# Patient Record
Sex: Female | Born: 1975 | Race: White | Hispanic: No | Smoking: Former smoker
Health system: Southern US, Community
[De-identification: ages and names within clinical notes are randomized; demographics above are authoritative.]

## PROBLEM LIST (undated history)

## (undated) ENCOUNTER — Emergency Department: Discharge status: Absent without leave | Payer: Self-pay

## (undated) DIAGNOSIS — E669 Obesity, unspecified: Secondary | ICD-10-CM

## (undated) DIAGNOSIS — E785 Hyperlipidemia, unspecified: Secondary | ICD-10-CM

## (undated) DIAGNOSIS — A6000 Herpesviral infection of urogenital system, unspecified: Secondary | ICD-10-CM

## (undated) HISTORY — DX: Obesity, unspecified: E66.9

## (undated) HISTORY — PX: TONSILLECTOMY: SUR1361

---

## 1999-01-01 ENCOUNTER — Emergency Department (HOSPITAL_COMMUNITY): Admission: EM | Admit: 1999-01-01 | Discharge: 1999-01-01 | Payer: Self-pay | Admitting: Emergency Medicine

## 1999-01-01 ENCOUNTER — Encounter: Payer: Self-pay | Admitting: Emergency Medicine

## 2008-12-28 LAB — CONVERTED CEMR LAB: Pap Smear: NORMAL

## 2009-11-24 ENCOUNTER — Ambulatory Visit: Payer: Self-pay | Admitting: Family Medicine

## 2009-11-24 DIAGNOSIS — R61 Generalized hyperhidrosis: Secondary | ICD-10-CM

## 2009-11-24 DIAGNOSIS — A6000 Herpesviral infection of urogenital system, unspecified: Secondary | ICD-10-CM | POA: Insufficient documentation

## 2010-01-05 ENCOUNTER — Ambulatory Visit: Payer: Self-pay | Admitting: Family Medicine

## 2010-01-05 ENCOUNTER — Encounter: Payer: Self-pay | Admitting: Family Medicine

## 2010-01-08 LAB — CONVERTED CEMR LAB
ALT: 21 units/L (ref 0–35)
AST: 18 units/L (ref 0–37)
Albumin: 4.7 g/dL (ref 3.5–5.2)
Alkaline Phosphatase: 60 units/L (ref 39–117)
BUN: 11 mg/dL (ref 6–23)
CO2: 25 meq/L (ref 19–32)
Calcium: 9.4 mg/dL (ref 8.4–10.5)
Chloride: 102 meq/L (ref 96–112)
Cholesterol: 183 mg/dL (ref 0–200)
Creatinine, Ser: 0.78 mg/dL (ref 0.40–1.20)
Glucose, Bld: 98 mg/dL (ref 70–99)
HDL: 55 mg/dL (ref >39)
LDL Cholesterol: 111 mg/dL — ABNORMAL HIGH (ref 0–99)
Potassium: 4.6 meq/L (ref 3.5–5.3)
Sodium: 138 meq/L (ref 135–145)
Total Bilirubin: 0.9 mg/dL (ref 0.3–1.2)
Total CHOL/HDL Ratio: 3.3
Total Protein: 7.3 g/dL (ref 6.0–8.3)
Triglycerides: 87 mg/dL (ref <150)
VLDL: 17 mg/dL (ref 0–40)

## 2010-02-27 NOTE — Assessment & Plan Note (Signed)
Summary: CPE   Vital Signs:  Patient profile:   35 year old female Height:      64.5 inches Weight:      184 pounds Pulse rate:   95 / minute BP sitting:   133 / 80  (right arm) Cuff size:   regular  Vitals Entered By: Avon Gully CMA, Duncan Dull) (January 05, 2010 9:06 AM) CC: CPE  no pap   CC:  CPE  no pap.  History of Present Illness: Here for CPE. Her pap is scheduled for January. HAd an abnormal mammo this year but had f/u US and this was Ok. They are repeating her mammo in 6 months.   Current Medications (verified): 1)  Acyclovir 200 Mg Caps (Acyclovir) .... Take One Tablet By Mouth Once A Day 2)  Acidophilus  Caps (Lactobacillus) .... One Capsule By Mouth Once A Day 3)  Hypercare 20 % Soln (Aluminum Chloride) .... Apply At Bedtime 2-3 X A Week. 4)  Diflucan 150 Mg Tabs (Fluconazole) .... Take 1 Tablet By Mouth Once A Day X 1 Day and Then Repeat in 3 Days.  Allergies (verified): No Known Drug Allergies  Comments:  Nurse/Medical Assistant: The patient's medications and allergies were reviewed with the patient and were updated in the Medication and Allergy Lists. Avon Gully CMA, Duncan Dull) (January 05, 2010 9:07 AM)  Past History:  Past Surgical History: tonsillecotmy 1990 c/sec 1996 Toe fracture repair 2005, left foot, 5th digit  Social History: Charity fundraiser. Former Smoker Alcohol use-yes Drug use-no Regular exercise-yes  24 oz of caffein a day.   Review of Systems  The patient denies anorexia, fever, weight loss, weight gain, vision loss, decreased hearing, hoarseness, chest pain, syncope, dyspnea on exertion, peripheral edema, prolonged cough, headaches, hemoptysis, abdominal pain, melena, hematochezia, severe indigestion/heartburn, hematuria, incontinence, genital sores, muscle weakness, suspicious skin lesions, transient blindness, difficulty walking, depression, unusual weight change, abnormal bleeding, enlarged lymph nodes, and breast masses.     Physical Exam  General:  Well-developed,well-nourished,in no acute distress; alert,appropriate and cooperative throughout examination Head:  Normocephalic and atraumatic without obvious abnormalities. No apparent alopecia or balding. Eyes:  No corneal or conjunctival inflammation noted. EOMI. Perrla.  Ears:  External ear exam shows no significant lesions or deformities.  Otoscopic examination reveals clear canals, tympanic membranes are intact bilaterally without bulging, retraction, inflammation or discharge. Hearing is grossly normal bilaterally. Nose:  External nasal examination shows no deformity or inflammation.  Mouth:  Oral mucosa and oropharynx without lesions or exudates.  Teeth in good repair. Neck:  No deformities, masses, or tenderness noted. Chest Wall:  No deformities, masses, or tenderness noted. Breasts:  No mass, nodules, thickening, tenderness, bulging, retraction, inflamation, nipple discharge or skin changes noted.   Lungs:  Normal respiratory effort, chest expands symmetrically. Lungs are clear to auscultation, no crackles or wheezes. Heart:  Normal rate and regular rhythm. S1 and S2 normal without gallop, murmur, click, rub or other extra sounds. Abdomen:  Bowel sounds positive,abdomen soft and non-tender without masses, organomegaly or hernias noted. Msk:  No deformity or scoliosis noted of thoracic or lumbar spine.   Pulses:  R and L carotid,radial,dorsalis pedis and posterior tibial pulses are full and equal bilaterally Extremities:  No clubbing, cyanosis, edema, or deformity noted with normal full range of motion of all joints.   Neurologic:  No cranial nerve deficits noted. Station and gait are normal. Plantar reflexes are down-going bilaterally.  Sensory, motor and coordinative functions appear intact. Skin:  no rashes.  Cervical Nodes:  No lymphadenopathy noted Axillary Nodes:  No palpable lymphadenopathy Psych:  Cognition and judgment appear intact. Alert and  cooperative with normal attention span and concentration. No apparent delusions, illusions, hallucinations   Impression & Recommendations:  Problem # 1:  HEALTH MAINTENANCE EXAM (ICD-V70.0) Exam is normal today.  she is getting regular exercis now which is wonderful.  Encourage regular exicise and daily calcium Due for screening labs.  Orders: T-Comprehensive Metabolic Panel [phone]) T-Lipid Profile [phone])  Complete Medication List: 1)  Acyclovir 200 Mg Caps (Acyclovir) .... Take one tablet by mouth once a day 2)  Acidophilus Caps (Lactobacillus) .... One capsule by mouth once a day 3)  Hypercare 20 % Soln (Aluminum chloride) .... Apply at bedtime 2-3 x a week.  Other Orders: Tdap => 31yrs IM ([id number]) Admin 1st Vaccine (69629)   Patient Instructions: 1)  It is important that you exercise reguarly at least 20 minutes 5 times a week. If you develop chest pain, have severe difficulty breathing, or feel very tired, stop exercising immediately and seek medical attention.  2)  Take calcium +vitamin D daily.  3)  We will call you with your lab results next week.    Orders Added: 1)  T-Comprehensive Metabolic Panel [[id number]] 2)  T-Lipid Profile [[id number]] 3)  Tdap => 71yrs IM [90715] 4)  Admin 1st Vaccine [90471] 5)  Est. Patient age 39-39 [37]   Immunizations Administered:  Tetanus Vaccine:    Vaccine Type: Tdap    Site: right deltoid    Mfr: GlaxoSmithKline    Dose: 0.5 ml    Route: IM    Given by: Sue Lush McCrimmon CMA, (AAMA)    Exp. Date: 11/17/2011    Lot #: [id number]    VIS given: 12/16/07 version given January 05, 2010.   Immunizations Administered:  Tetanus Vaccine:    Vaccine Type: Tdap    Site: right deltoid    Mfr: GlaxoSmithKline    Dose: 0.5 ml    Route: IM    Given by: Sue Lush McCrimmon CMA, (AAMA)    Exp. Date: 11/17/2011    Lot #: [id number]    VIS given: 12/16/07 version given January 05, 2010.  Last PAP:  normal  (01/07/2007 12:55:55 PM) PAP Result Date:  12/28/2008 PAP Result:  normal PAP Next Due:  1 yr

## 2010-02-27 NOTE — Assessment & Plan Note (Signed)
Summary: NOV: GEt estab   Vital Signs:  Patient profile:   35 year old [name] Height:      64.5 inches Weight:      185 pounds BMI:     31.38 Pulse rate:   Breanna / minute BP sitting:   119 / 76  (right arm) Cuff size:   large  Vitals Entered By: Avon Gully CMA, Duncan Dull) (November 24, 2009 8:24 AM) CC: NP esr care   CC:  NP esr care.  History of Present Illness: Going for mammogram today. Breanna [name] wiht hx of BrCA at age 19.   Habits & Providers  Alcohol-Tobacco-Diet     Alcohol drinks/day: <1     Tobacco Status: quit  Exercise-Depression-Behavior     Does Patient Exercise: no     STD Risk: current     Drug Use: no     Seat Belt Use: always  Current Medications (verified): 1)  Acyclovir 200 Mg Caps (Acyclovir) .... Take One Tablet By Mouth Once A Day 2)  Acidophilus  Caps (Lactobacillus) .... One Capsule By Mouth Once A Day 3)  Hypercare 20 % Soln (Aluminum Chloride)  Allergies (verified): No Known Drug Allergies  Comments:  Nurse/Medical Assistant: The patient's medications and allergies were reviewed with the patient and were updated in the Medication and Allergy Lists. Avon Gully CMA, Duncan Dull) (November 24, 2009 8:26 AM)  Past History:  Past Medical History: None  Past Surgical History: tonsillecotmy 1990 c/sec 1996 Toe fracture repair 2005  Family History: Breanna [name] with BrCa at age 9 ( post menopausal) , DM Parents with hi chol, HTN  Social History: Former Smoker Alcohol use-yes Drug use-no Regular exercise-no 24 oz of caffein a day. Smoking Status:  quit STD Risk:  current Drug Use:  no Seat Belt Use:  always Does Patient Exercise:  no  Review of Systems       No fever/sweats/weakness, unexplained weight loss/gain.  No vison changes.  No difficulty hearing/ringing in ears, + hay fever/allergies.  No chest pain/discomfort, palpitations.  No Br lump/nipple discharge.  No cough/wheeze.  No blood in BM, nausea/vomiting/diarrhea.  No nighttime  urination, leaking urine, unusual vaginal bleeding, discharge (penis or vagina).  No muscle/joint pain. No rash, change in mole.  No HA, memory loss.  No anxiety, sleep d/o, depression.  No easy bruising/bleeding, unexplained lump   Physical Exam  General:  Well-developed,well-nourished,in no acute distress; alert,appropriate and cooperative throughout examination Head:  Normocephalic and atraumatic without obvious abnormalities. No apparent alopecia or balding. Lungs:  Normal respiratory effort, chest expands symmetrically. Lungs are clear to auscultation, no crackles or wheezes. Heart:  Normal rate and regular rhythm. S1 and S2 normal without gallop, murmur, click, rub or other extra sounds.   Impression & Recommendations:  Problem # 1:  HYPERHIDROSIS (ICD-780.8) Refilled her hypercare. She is on one a month maintenance for that.  Did recommend schedule a CPE.   She has had her flu vac already.   Complete Medication List: 1)  Acyclovir 200 Mg Caps (Acyclovir) .... Take one tablet by mouth once a day 2)  Acidophilus Caps (Lactobacillus) .... One capsule by mouth once a day 3)  Hypercare 20 % Soln (Aluminum chloride) .... Apply at bedtime 2-3 x a week. 4)  Diflucan 150 Mg Tabs (Fluconazole) .... Take 1 tablet by mouth once a day x 1 day and then repeat in 3 days.  Patient Instructions: 1)  Let us know when you had your tetanus vaccine.   2)  Consider scheduling a physical.  Prescriptions: HYPERCARE 20 % SOLN (ALUMINUM CHLORIDE) Apply at bedtime 2-3 x a week.  #1 bottle. x 1   Entered and Authorized by:   Nani Gasser MD   Signed by:   Nani Gasser MD on 11/24/2009   Method used:   Electronically to        [facility] [phone]* (retail)       [address] [place], [place]  [address]       Ph: [phone]       Fax: [phone]   RxID:   [id number]    Orders Added: 1)  New Patient Level II [81829]   Immunization History:  Influenza  Immunization History:    Influenza:  historical (10/30/2009)   Immunization History:  Influenza Immunization History:    Influenza:  Historical (10/30/2009)   Immunization History:  Influenza Immunization History:    Influenza:  historical (10/30/2009)  Appended Document: NOV: GEt estab   Preventive Care Screening  Pap Smear:    Date:  01/07/2007    Next Due:  12/2007    Results:  normal

## 2010-03-01 NOTE — Letter (Signed)
Summary: Records Dated 10-09-06 thru 11-03-09/[facility]  Records Dated 10-09-06 thru 11-03-09/[facility]   Imported By: Lanelle Bal 01/15/2010 10:40:24  _____________________________________________________________________  External Attachment:    Type:   Image     Comment:   External Document

## 2010-06-04 ENCOUNTER — Encounter: Payer: Self-pay | Admitting: Emergency Medicine

## 2010-06-04 ENCOUNTER — Inpatient Hospital Stay (INDEPENDENT_AMBULATORY_CARE_PROVIDER_SITE_OTHER)
Admission: RE | Admit: 2010-06-04 | Discharge: 2010-06-04 | Disposition: A | Discharge status: Home / Self Care | Payer: BC Managed Care – PPO | Source: Ambulatory Visit | Attending: Emergency Medicine | Admitting: Emergency Medicine

## 2010-06-04 DIAGNOSIS — Z23 Encounter for immunization: Secondary | ICD-10-CM

## 2010-06-04 DIAGNOSIS — S61409A Unspecified open wound of unspecified hand, initial encounter: Secondary | ICD-10-CM | POA: Insufficient documentation

## 2010-06-07 ENCOUNTER — Telehealth (INDEPENDENT_AMBULATORY_CARE_PROVIDER_SITE_OTHER): Payer: Self-pay | Admitting: Emergency Medicine

## 2010-12-26 ENCOUNTER — Emergency Department
Admission: EM | Admit: 2010-12-26 | Discharge: 2010-12-26 | Disposition: A | Discharge status: Home / Self Care | Payer: BC Managed Care – PPO | Source: Home / Self Care

## 2010-12-26 DIAGNOSIS — Z23 Encounter for immunization: Secondary | ICD-10-CM

## 2010-12-26 MED ORDER — INFLUENZA VAC TYP A&B SURF ANT IM INJ: 0.5000 mL | INJECTION | INTRAMUSCULAR | Status: DC

## 2010-12-26 MED ORDER — INFLUENZA VAC TYP A&B SURF ANT IM INJ
0.5000 mL | INJECTION | INTRAMUSCULAR | Status: AC
  Administered 2010-12-26: 0.5 mL via INTRAMUSCULAR

## 2010-12-31 NOTE — Telephone Encounter (Signed)
  Phone Note Outgoing Call   Call placed by: Emilio Math,  Jun 07, 2010 11:50 AM Call placed to: Patient Summary of Call: Hand is sore but shows no signs of infection.

## 2010-12-31 NOTE — Progress Notes (Signed)
Summary: RT HAND INJURY (DOG BITE)/WSE   Vital Signs:  Patient Profile:   35 Years Old Female CC:      dog bite to right hand x today Height:     64.5 inches Weight:      192 pounds O2 Sat:      98 % O2 treatment:    Room Air Temp:     99.5 degrees F oral Pulse rate:   121 / minute Resp:     16 per minute BP sitting:   123 / 86  (left arm) Cuff size:   large  Vitals Entered By: Lajean Saver RN (Jun 04, 2010 6:36 PM)                  Updated Prior Medication List: CLARITIN-D 24 HOUR 10-240 MG XR24H-TAB (LORATADINE-PSEUDOEPHEDRINE)  MULTIVITAMINS  TABS (MULTIPLE VITAMIN) once daily  Current Allergies: ! NAPROSYNHistory of Present Illness History from: patient Chief Complaint: dog bite to right hand x today History of Present Illness: She was protecting her dog today and her neighbor's dog bit her hand.  They say the neighbot's dog is UTD on shots.   It drew blood in her R palm and her R hand is also red.  She doesn't recall her last Td.  Pain and mild swelling.  Not using any meds or modalities yet.  No pus or drainage.  She is R handed.  REVIEW OF SYSTEMS Constitutional Symptoms      Denies fever, chills, night sweats, weight loss, weight gain, and fatigue.  Eyes       Denies change in vision, eye pain, eye discharge, glasses, contact lenses, and eye surgery. Ear/Nose/Throat/Mouth       Denies hearing loss/aids, change in hearing, ear pain, ear discharge, dizziness, frequent runny nose, frequent nose bleeds, sinus problems, sore throat, hoarseness, and tooth pain or bleeding.  Respiratory       Denies dry cough, productive cough, wheezing, shortness of breath, asthma, bronchitis, and emphysema/COPD.  Cardiovascular       Denies murmurs, chest pain, and tires easily with exhertion.    Gastrointestinal       Denies stomach pain, nausea/vomiting, diarrhea, constipation, blood in bowel movements, and indigestion. Genitourniary       Denies painful urination, kidney  stones, and loss of urinary control. Neurological       Denies paralysis, seizures, and fainting/blackouts. Musculoskeletal       Denies muscle pain, joint pain, joint stiffness, decreased range of motion, redness, swelling, muscle weakness, and gout.  Skin       Denies bruising, unusual mles/lumps or sores, and hair/skin or nail changes.  Psych       Denies mood changes, temper/anger issues, anxiety/stress, speech problems, depression, and sleep problems. Other Comments: Broke up a fight between the neighbors dog and her dog. Small punture wound to right hand. Bleeding stopped, swelling notedc.Cleaned with Hibicleanse. TDaP needed. Bite report already made by the owners of the dogs to [employer]   Past History:  Past Medical History: Reviewed history from 11/24/2009 and no changes required. None  Past Surgical History: Reviewed history from 01/05/2010 and no changes required. tonsillecotmy 1990 c/sec 1996 Toe fracture repair 2005, left foot, 5th digit  Family History: Reviewed history from 11/24/2009 and no changes required. 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  Physical Exam General appearance: well developed, well nourished, no acute distress Head: normocephalic, atraumatic MSE: oriented to time, place, and person R hand with scattered erythema with a puncture mark on her R thenar emanance.  No bleeding.  Mild TTP.  Minimal localized swelling.  No ecchymoses.  FROM of thumb and all fingers.  Sensation and pulses and cap refill all intact. Assessment New Problems: WOUND, OPEN, HAND, WITHOUT COMPLICATIONS (ICD-882.0)   Plan New Medications/Changes: DOXYCYCLINE HYCLATE 100 MG CAPS (DOXYCYCLINE HYCLATE) 1 by mouth two times a day for 10 days  #20  x 0, 06/04/2010, Hoyt Koch MD  New Orders: Tdap => 47yrs IM [90715] Admin 1st Vaccine [90471] New Patient Level III [99203] Planning Comments:   Treat with Doxy to cover canine oral bacteria.  Td given in clinic.  Hand washed by nurse and cleansed.  Hand covered with bandage.  Wound precautions given.  We'll call patient back in 3 days to check.  They have already contacted the county re: the bite.   The patient and/or caregiver has been counseled thoroughly with regard to medications prescribed including dosage, schedule, interactions, rationale for use, and possible side effects and they verbalize understanding.  Diagnoses and expected course of recovery discussed and will return if not improved as expected or if the condition worsens. Patient and/or caregiver verbalized understanding.  Prescriptions: DOXYCYCLINE HYCLATE 100 MG CAPS (DOXYCYCLINE HYCLATE) 1 by mouth two times a day for 10 days  #20 x 0   Entered and Authorized by:   Hoyt Koch MD   Signed by:   Hoyt Koch MD on 06/04/2010   Method used:   Print then Give to Patient   RxID:   [id number]   Orders Added: 1)  Tdap => 83yrs IM [14782] 2)  Admin 1st Vaccine [90471] 3)  New Patient Level III [95621]   Immunizations Administered:  Tetanus Vaccine:    Vaccine Type: Tdap    Site: left deltoid    Mfr: GlaxoSmithKline    Dose: 0.5 ml    Route: IM    Given by: Lajean Saver RN    Exp. Date: 11/17/2011    Lot #: [id number]    VIS given: 12/16/07 version given Jun 04, 2010.   Immunizations Administered:  Tetanus Vaccine:    Vaccine Type: Tdap    Site: left deltoid    Mfr: GlaxoSmithKline    Dose: 0.5 ml    Route: IM    Given by: Lajean Saver RN    Exp. Date: 11/17/2011    Lot #: [id number]    VIS given: 12/16/07 version given Jun 04, 2010.

## 2011-11-19 ENCOUNTER — Encounter: Payer: Self-pay | Admitting: *Deleted

## 2011-11-19 ENCOUNTER — Emergency Department (INDEPENDENT_AMBULATORY_CARE_PROVIDER_SITE_OTHER)
Admission: EM | Admit: 2011-11-19 | Discharge: 2011-11-19 | Disposition: A | Discharge status: Home / Self Care | Payer: BC Managed Care – PPO | Source: Home / Self Care

## 2011-11-19 DIAGNOSIS — Z23 Encounter for immunization: Secondary | ICD-10-CM

## 2011-11-19 MED ORDER — INFLUENZA VAC TYP A&B SURF ANT IM INJ
0.5000 mL | INJECTION | Freq: Once | INTRAMUSCULAR | Status: AC
  Administered 2011-11-19: 0.5 mL via INTRAMUSCULAR

## 2011-11-19 NOTE — ED Notes (Signed)
The pt is here today for a flu vaccine.  

## 2012-05-13 ENCOUNTER — Telehealth: Payer: Self-pay | Admitting: Family Medicine

## 2012-05-13 NOTE — Telephone Encounter (Signed)
Ok to change

## 2012-05-13 NOTE — Telephone Encounter (Signed)
Patient is a pt of Dr. Linford Arnold but hasnt seen her since 12/2009 not quite 3 yrs but she request to change providers to Dr. Karie Schwalbe. Thanks

## 2012-05-13 NOTE — Telephone Encounter (Signed)
Hmm, doesn't look like I have ever seen her but it is fine with me.

## 2012-05-26 ENCOUNTER — Encounter: Payer: Self-pay | Admitting: Sports Medicine

## 2012-05-26 ENCOUNTER — Ambulatory Visit (INDEPENDENT_AMBULATORY_CARE_PROVIDER_SITE_OTHER): Payer: BC Managed Care – PPO | Admitting: Sports Medicine

## 2012-05-26 VITALS — BP 130/86 | HR 95 | Wt 220.0 lb

## 2012-05-26 DIAGNOSIS — N6002 Solitary cyst of left breast: Secondary | ICD-10-CM | POA: Insufficient documentation

## 2012-05-26 DIAGNOSIS — Z0000 Encounter for general adult medical examination without abnormal findings: Secondary | ICD-10-CM

## 2012-05-26 DIAGNOSIS — R21 Rash and other nonspecific skin eruption: Secondary | ICD-10-CM

## 2012-05-26 DIAGNOSIS — Z299 Encounter for prophylactic measures, unspecified: Secondary | ICD-10-CM

## 2012-05-26 DIAGNOSIS — E669 Obesity, unspecified: Secondary | ICD-10-CM | POA: Insufficient documentation

## 2012-05-26 MED ORDER — PHENTERMINE HCL 37.5 MG PO CAPS: 37.5000 mg | ORAL_CAPSULE | ORAL | Status: DC

## 2012-05-26 NOTE — Assessment & Plan Note (Signed)
Triamcinolone cream. I do suspect this is a contact dermatitis for an irritant dermatitis. This is a common location for rosacea, and she will keep a log of what worsens it, and take a picture next time it occurs. Because it is also photosensitive, I'm going to add an ANA to her blood work.

## 2012-05-26 NOTE — Assessment & Plan Note (Signed)
Gets every 6 month mammograms. This is done through [facility] imaging.

## 2012-05-26 NOTE — Assessment & Plan Note (Signed)
Gets yearly Pap smears, last was January. This is done by her gynecologist at [facility]. Checking some routine blood work.

## 2012-05-26 NOTE — Progress Notes (Signed)
  Subjective:    CC: Complete physical  HPI:  Preventive measure: Needs some blood work, last Pap smear was January of this year, goes to a gynecologist in town. Had a mammogram recently, needs them every year, she does have cystic changes in the left breast.  Left breast cyst: Has frequent mammograms. Stable.  Facial rash: Presently she wakes up, she describes them as small red bumps, they're not itchy, no perfumes, does wear safety goggles at work. It is not worse with spicy foods, temperature changes, or stress. 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  Obesity: Has tried exercise, ineffective. Would like to discuss ways to cut weight.  Past medical history, Surgical history, Family history not pertinant except as noted below, Social history, Allergies, and medications have been entered into the medical record, reviewed, and no changes needed.   Review of Systems: No headache, visual changes, nausea, vomiting, diarrhea, constipation, dizziness, abdominal pain, skin rash, fevers, chills, night sweats, swollen lymph nodes, weight loss, chest pain, body aches, joint swelling, muscle aches, shortness of breath, mood changes, visual or auditory hallucinations.  Objective:    General: Well Developed, well nourished, and in no acute distress.  Neuro: Alert and oriented x3, extra-ocular muscles intact, sensation grossly intact.  HEENT: Normocephalic, atraumatic, pupils equal round reactive to light, neck supple, no masses, no lymphadenopathy, thyroid nonpalpable.  Skin: Warm and dry, no rashes noted.  Cardiac: Regular rate and rhythm, no murmurs rubs or gallops.  Respiratory: Clear to auscultation bilaterally. Not using accessory muscles, speaking in full sentences.  Abdominal: Soft, nontender, nondistended, positive bowel sounds, no masses, no organomegaly.  Musculoskeletal: Shoulder, elbow, wrist, hip, knee, ankle stable, and with full range of motion. Impression and  Recommendations:    The patient was counselled, risk factors were discussed, anticipatory guidance given.

## 2012-05-26 NOTE — Assessment & Plan Note (Signed)
Nutritionist visit, exercise prescription given, phentermine. Return in one month.

## 2012-05-27 LAB — CBC
HCT: 41.6 % (ref 36.0–46.0)
Hemoglobin: 14.2 g/dL (ref 12.0–15.0)
MCH: 30 pg (ref 26.0–34.0)
MCHC: 34.1 g/dL (ref 30.0–36.0)
MCV: 87.8 fL (ref 78.0–100.0)
Platelets: 310 10*3/uL (ref 150–400)
RBC: 4.74 MIL/uL (ref 3.87–5.11)
RDW: 13.3 % (ref 11.5–15.5)
WBC: 6.3 10*3/uL (ref 4.0–10.5)

## 2012-05-28 LAB — COMPREHENSIVE METABOLIC PANEL
ALT: 32 U/L (ref 0–35)
BUN: 11 mg/dL (ref 6–23)
CO2: 27 mEq/L (ref 19–32)
Calcium: 9.5 mg/dL (ref 8.4–10.5)
Chloride: 101 mEq/L (ref 96–112)
Creat: 0.72 mg/dL (ref 0.50–1.10)
Glucose, Bld: 105 mg/dL — ABNORMAL HIGH (ref 70–99)

## 2012-05-28 LAB — COMPREHENSIVE METABOLIC PANEL WITH GFR
AST: 17 U/L (ref 0–37)
Albumin: 4.4 g/dL (ref 3.5–5.2)
Alkaline Phosphatase: 63 U/L (ref 39–117)
Potassium: 4.3 meq/L (ref 3.5–5.3)
Sodium: 139 meq/L (ref 135–145)
Total Bilirubin: 0.6 mg/dL (ref 0.3–1.2)
Total Protein: 6.9 g/dL (ref 6.0–8.3)

## 2012-05-28 LAB — VITAMIN D 25 HYDROXY (VIT D DEFICIENCY, FRACTURES): Vit D, 25-Hydroxy: 47 ng/mL (ref 30–89)

## 2012-05-28 LAB — LIPID PANEL
Cholesterol: 204 mg/dL — ABNORMAL HIGH (ref 0–200)
HDL: 52 mg/dL (ref >39)
LDL Cholesterol: 125 mg/dL — ABNORMAL HIGH (ref 0–99)
Total CHOL/HDL Ratio: 3.9 Ratio
Triglycerides: 135 mg/dL (ref <150)
VLDL: 27 mg/dL (ref 0–40)

## 2012-05-28 LAB — ANA: Anti Nuclear Antibody(ANA): NEGATIVE

## 2012-05-28 LAB — HEMOGLOBIN A1C
Hgb A1c MFr Bld: 5.4 % (ref <5.7)
Mean Plasma Glucose: 108 mg/dL (ref <117)

## 2012-05-28 LAB — TSH: TSH: 0.816 u[IU]/mL (ref 0.350–4.500)

## 2012-06-23 ENCOUNTER — Encounter: Payer: BC Managed Care – PPO | Attending: Sports Medicine | Admitting: *Deleted

## 2012-06-23 ENCOUNTER — Encounter: Payer: Self-pay | Admitting: *Deleted

## 2012-06-23 VITALS — Ht 64.0 in | Wt 216.5 lb

## 2012-06-23 DIAGNOSIS — E669 Obesity, unspecified: Secondary | ICD-10-CM | POA: Insufficient documentation

## 2012-06-23 DIAGNOSIS — Z713 Dietary counseling and surveillance: Secondary | ICD-10-CM | POA: Insufficient documentation

## 2012-06-23 NOTE — Progress Notes (Signed)
  Medical Nutrition Therapy:  Appt start time: 1630 end time:  1730.  Assessment:  Primary concerns today: obesity. 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 Lives with husband who is here with her and their 38 yo son. She states they shop and cook together. They do some yard work and maintain the house but no scheduled activity plan. They enjoy going to the farmer's market and are involved with church activities and friends.She states her mother passed away last year and since then she has been under a lot of emotional stress. She didn't pay much attention to her food choices and quit exercising. Now, even with healthier food choices she has not been able to lose weight.  MEDICATIONS: see list   DIETARY INTAKE: Usual eating pattern includes 3 meals and 0-1 snacks per day.  Everyday foods include good variety of all food groups.  Avoided foods include high fat and high carb foods.    24-hr recall:  B ( AM): [place] yogurt OR packet of hot cereal OR occasionally an egg with [place] sandwich  Snk ( AM): none  L ( PM): sandwich with fresh fruit and 90 calorie granola bar Snk ( PM): none D ( PM): eat out once a week at [employer], meat and vegetables, occasionally a potato or other starch Snk ( PM): 1-2 cookies OR jello OR  Beverages: coffee with Sweet and Low, G-2 Garotade, flavored water, unsweetened tea  Usual physical activity: used to go to gym 3-5 nights a week until Mom passed away last year.   Estimated energy needs: 1400 calories 135 g carbohydrates 90 g protein 33 g fat  Progress Towards Goal(s):  In progress.    Nutritional Diagnosis:  NB-1.1 Food and nutrition-related knowledge deficit As related to activity level.  As evidenced by BMI of 37.2    Intervention:  Nutrition counseling and weight loss education initiated. Discussed Carb Counting and reading food labels as method of portion control, and benefits of increased activity. Acknowledged  that she has healthy eating habits and the key to weight loss will be increasing her activity level.  Plan:  Aim for 2-3 Carb Choices per meal (30-45 grams) +/- 1 either way  Aim for 0-2 Carbs per snack if hungry  Consider reading food labels for Total Carbohydrate of foods Consider fun ways of increasing your activity level daily as tolerated    Handouts given during visit include: Carb Counting and Food Label handouts Meal Plan Card  Monitoring/Evaluation:  Dietary intake, exercise, reading food labels, and body weight prn.

## 2012-06-23 NOTE — Patient Instructions (Addendum)
Plan:  Aim for 2-3 Carb Choices per meal (30-45 grams) +/- 1 either way  Aim for 0-2 Carbs per snack if hungry  Consider reading food labels for Total Carbohydrate of foods Consider fun ways of increasing your activity level daily as tolerated

## 2012-06-24 ENCOUNTER — Ambulatory Visit: Payer: BC Managed Care – PPO | Admitting: *Deleted

## 2012-06-25 ENCOUNTER — Ambulatory Visit (INDEPENDENT_AMBULATORY_CARE_PROVIDER_SITE_OTHER): Payer: BC Managed Care – PPO

## 2012-06-25 ENCOUNTER — Encounter: Payer: Self-pay | Admitting: *Deleted

## 2012-06-25 ENCOUNTER — Ambulatory Visit (INDEPENDENT_AMBULATORY_CARE_PROVIDER_SITE_OTHER): Payer: BC Managed Care – PPO | Admitting: Sports Medicine

## 2012-06-25 ENCOUNTER — Encounter: Payer: Self-pay | Admitting: Sports Medicine

## 2012-06-25 VITALS — BP 126/82 | HR 105 | Wt 216.0 lb

## 2012-06-25 DIAGNOSIS — E669 Obesity, unspecified: Secondary | ICD-10-CM

## 2012-06-25 DIAGNOSIS — M5412 Radiculopathy, cervical region: Secondary | ICD-10-CM | POA: Insufficient documentation

## 2012-06-25 DIAGNOSIS — M542 Cervicalgia: Secondary | ICD-10-CM

## 2012-06-25 DIAGNOSIS — R21 Rash and other nonspecific skin eruption: Secondary | ICD-10-CM

## 2012-06-25 MED ORDER — TRIAMCINOLONE ACETONIDE 0.5 % EX CREA: TOPICAL_CREAM | Freq: Two times a day (BID) | CUTANEOUS | Status: DC

## 2012-06-25 MED ORDER — PHENTERMINE HCL 37.5 MG PO CAPS: 37.5000 mg | ORAL_CAPSULE | ORAL | Status: DC

## 2012-06-25 MED ORDER — MELOXICAM 15 MG PO TABS: ORAL_TABLET | ORAL | Status: DC

## 2012-06-25 NOTE — Assessment & Plan Note (Signed)
Left-sided. Meloxicam, x-rays, home exercises. Return in one month, MRI if no better.

## 2012-06-25 NOTE — Assessment & Plan Note (Signed)
4 pound weight loss. Refilling phentermine, return one month for weight check.

## 2012-06-25 NOTE — Assessment & Plan Note (Signed)
I think that they have narrowed it down to a particular cosmetic cream. Adding a steroid ointment. Return as needed for this.

## 2012-06-25 NOTE — Progress Notes (Signed)
  Subjective:    CC: Followup  HPI: Obesity: 4 pound weight loss in one month with phentermine, needs refill.  Facial rash: Has narrowed down to a particular facial cosmetic, improving since she has stopped this cosmetic, however she still wants the facial cream which I did not call in last time.  Right hand numbness: Present in the fourth and fifth fingers, sits all day typing, stable, nothing makes it better, nothing makes it worse, moderate.  Past medical history, Surgical history, Family history not pertinant except as noted below, Social history, Allergies, and medications have been entered into the medical record, reviewed, and no changes needed.   Review of Systems: No fevers, chills, night sweats, weight loss, chest pain, or shortness of breath.   Objective:    General: Well Developed, well nourished, and in no acute distress.  Neuro: Alert and oriented x3, extra-ocular muscles intact, sensation grossly intact.  HEENT: Normocephalic, atraumatic, pupils equal round reactive to light, neck supple, no masses, no lymphadenopathy, thyroid nonpalpable.  Skin: Warm and dry, no rashes. Cardiac: Regular rate and rhythm, no murmurs rubs or gallops, no lower extremity edema.  Respiratory: Clear to auscultation bilaterally. Not using accessory muscles, speaking in full sentences. Neck: Inspection unremarkable. No palpable stepoffs. Negative Spurling's maneuver. Full neck range of motion Grip strength and sensation normal in bilateral hands Strength good C4 to T1 distribution No sensory change to C4 to T1 Negative Hoffman sign bilaterally Reflexes normal  X-rays were reviewed and are negative for fracture, dislocation, or degenerative change.  Impression and Recommendations:

## 2012-07-23 ENCOUNTER — Encounter: Payer: Self-pay | Admitting: Sports Medicine

## 2012-07-23 ENCOUNTER — Ambulatory Visit (INDEPENDENT_AMBULATORY_CARE_PROVIDER_SITE_OTHER): Payer: BC Managed Care – PPO | Admitting: Sports Medicine

## 2012-07-23 VITALS — BP 131/86 | HR 106 | Wt 212.0 lb

## 2012-07-23 DIAGNOSIS — E669 Obesity, unspecified: Secondary | ICD-10-CM

## 2012-07-23 DIAGNOSIS — M5412 Radiculopathy, cervical region: Secondary | ICD-10-CM

## 2012-07-23 DIAGNOSIS — L989 Disorder of the skin and subcutaneous tissue, unspecified: Secondary | ICD-10-CM

## 2012-07-23 MED ORDER — TOPIRAMATE 100 MG PO TABS: 50.0000 mg | ORAL_TABLET | Freq: Every day | ORAL | Status: DC

## 2012-07-23 MED ORDER — PHENTERMINE HCL 37.5 MG PO CAPS: 37.5000 mg | ORAL_CAPSULE | ORAL | Status: DC

## 2012-07-23 NOTE — Assessment & Plan Note (Signed)
Resolved with home exercises and Mobic. She will take one half of her husband's Flexeril at bedtime, as Mobic effectiveness wanes during this time of the day

## 2012-07-23 NOTE — Progress Notes (Signed)
  Subjective:    CC: Followup  HPI: Obesity: 4 pounds weight loss. No adverse effects with phentermine.  Cervical radiculitis: Resolved with home exercises. She does note that the MOBIC's effectiveness wanes near the end of the day.  Skin lesion: Present for a few weeks, over the right deltoid, no pain, no bleeding.  Past medical history, Surgical history, Family history not pertinant except as noted below, Social history, Allergies, and medications have been entered into the medical record, reviewed, and no changes needed.   Review of Systems: No fevers, chills, night sweats, weight loss, chest pain, or shortness of breath.   Objective:    General: Well Developed, well nourished, and in no acute distress.  Neuro: Alert and oriented x3, extra-ocular muscles intact, sensation grossly intact.  HEENT: Normocephalic, atraumatic, pupils equal round reactive to light, neck supple, no masses, no lymphadenopathy, thyroid nonpalpable.  Skin: Warm and dry, no rashes. There is a lesion on the right deltoid that is raised, and pigmented. It is approximately 0.5 cm across. Cardiac: Regular rate and rhythm, no murmurs rubs or gallops, no lower extremity edema.  Respiratory: Clear to auscultation bilaterally. Not using accessory muscles, speaking in full sentences. Neck: Inspection unremarkable. No palpable stepoffs. Negative Spurling's maneuver. Full neck range of motion Grip strength and sensation normal in bilateral hands Strength good C4 to T1 distribution No sensory change to C4 to T1 Negative Hoffman sign bilaterally Reflexes normal Impression and Recommendations:

## 2012-07-23 NOTE — Assessment & Plan Note (Signed)
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 Return for biopsy at my next available spot.

## 2012-07-23 NOTE — Assessment & Plan Note (Signed)
There was an additional 4 pound weight loss. Refilled phentermine and adding topiramate for augmentation. Return in one month for weight checks and refills.

## 2012-07-30 ENCOUNTER — Ambulatory Visit (INDEPENDENT_AMBULATORY_CARE_PROVIDER_SITE_OTHER): Payer: BC Managed Care – PPO | Admitting: Sports Medicine

## 2012-07-30 ENCOUNTER — Encounter: Payer: Self-pay | Admitting: Sports Medicine

## 2012-07-30 VITALS — BP 125/78 | HR 89 | Wt 211.0 lb

## 2012-07-30 DIAGNOSIS — L989 Disorder of the skin and subcutaneous tissue, unspecified: Secondary | ICD-10-CM

## 2012-07-30 NOTE — Assessment & Plan Note (Signed)
Excision performed as above. We will await biopsy results.

## 2012-07-30 NOTE — Patient Instructions (Addendum)
Biopsy °Care After °Refer to this sheet in the next few weeks. These instructions provide you with information on caring for yourself after your procedure. Your caregiver may also give you more specific instructions. Your treatment has been planned according to current medical practices, but problems sometimes occur. Call your caregiver if you have any problems or questions after your procedure. °If you had a fine needle biopsy, you may have soreness at the biopsy site for 1 to 2 days. If you had an open biopsy, you may have soreness at the biopsy site for 3 to 4 days. °HOME CARE INSTRUCTIONS  °· You may resume normal diet and activities as directed. °· Change bandages (dressings) as directed. If your wound was closed with a skin glue (adhesive), it will wear off and begin to peel in 7 days. °· Only take over-the-counter or prescription medicines for pain, discomfort, or fever as directed by your caregiver. °· Ask your caregiver when you can bathe and get your wound wet. °SEEK IMMEDIATE MEDICAL CARE IF:  °· You have increased bleeding (more than a small spot) from the biopsy site. °· You notice redness, swelling, or increasing pain at the biopsy site. °· You have pus coming from the biopsy site. °· You have a fever. °· You notice a bad smell coming from the biopsy site or dressing. °· You have a rash, have difficulty breathing, or have any allergic problems. °MAKE SURE YOU:  °· Understand these instructions. °· Will watch your condition. °· Will get help right away if you are not doing well or get worse. °Document Released: 08/03/2004 Document Revised: 04/08/2011 Document Reviewed: 07/12/2010 °[employer]® Patient Information ©2014 [employer]. ° °

## 2012-07-30 NOTE — Progress Notes (Signed)
   Procedure:  Excision of right deltoid possible basal cell carcinoma versus melanocytic nevus, 1 cm in diameter. Risks, benefits, and alternatives explained and consent obtained. Time out conducted. Surface prepped with alcohol. 5cc lidocaine with epinephine infiltrated in a field block. Adequate anesthesia ensured. Area prepped and draped in a sterile fashion. Excision performed with: 4 mm punch biopsy used to take a core sample. A single 4-0 Ethilon single interrupted suture placed. Hemostasis achieved. Pt stable.

## 2012-08-06 ENCOUNTER — Encounter: Payer: Self-pay | Admitting: Sports Medicine

## 2012-08-06 ENCOUNTER — Ambulatory Visit: Payer: BC Managed Care – PPO | Admitting: Sports Medicine

## 2012-08-06 VITALS — BP 120/84 | HR 94 | Wt 209.0 lb

## 2012-08-06 DIAGNOSIS — L989 Disorder of the skin and subcutaneous tissue, unspecified: Secondary | ICD-10-CM

## 2012-08-06 NOTE — Progress Notes (Signed)
  Subjective:    CC: Suture removal  HPI: Breanna [name] was seen 7 days ago for lesion on her right shoulder that was removed by punch biopsy. Area is healing well. Patient has experienced no pain.  Past medical history, Surgical history, Family history not pertinant except as noted below, Social history, Allergies, and medications have been entered into the medical record, reviewed, and no changes needed.   Review of Systems: No fevers, chills, night sweats, weight loss, chest pain, or shortness of breath.   Objective:    General: Well Developed, well nourished, and in no acute distress.  Neuro: Alert and oriented x3, extra-ocular muscles intact, sensation grossly intact.  HEENT: Normocephalic, atraumatic, pupils equal round reactive to light, neck supple, no masses, no lymphadenopathy, thyroid nonpalpable.  Skin: Warm and dry, no rashes. Single interrupted suture removed from right shoulder, well healing, no pain.  Cardiac: Regular rate and rhythm, no murmurs rubs or gallops, no lower extremity edema.  Respiratory: Clear to auscultation bilaterally. Not using accessory muscles, speaking in full sentences. Biopsy: results - melanocytic nevus, margins were clear  Impression and Recommendations:    Skin lesion: Biopsy confirmed melanocytic nevus.  Sutures removed. No signs of infection. Return as needed.

## 2012-08-06 NOTE — Assessment & Plan Note (Signed)
Biopsy showed melanocytic nevus. Margins were clear. No signs of infection. Return as needed.

## 2012-08-20 ENCOUNTER — Ambulatory Visit (INDEPENDENT_AMBULATORY_CARE_PROVIDER_SITE_OTHER): Payer: BC Managed Care – PPO | Admitting: Sports Medicine

## 2012-08-20 ENCOUNTER — Encounter: Payer: Self-pay | Admitting: Sports Medicine

## 2012-08-20 VITALS — BP 125/79 | HR 94 | Wt 208.0 lb

## 2012-08-20 DIAGNOSIS — E669 Obesity, unspecified: Secondary | ICD-10-CM

## 2012-08-20 MED ORDER — PHENTERMINE HCL 37.5 MG PO CAPS: 37.5000 mg | ORAL_CAPSULE | ORAL | Status: DC

## 2012-08-20 NOTE — Assessment & Plan Note (Signed)
4 pound weight loss since the last visit. Refilling phentermine, unable to tolerate Topamax. Return in one month for weight checks and refills.

## 2012-08-20 NOTE — Progress Notes (Signed)
  Subjective:    CC: Wt check  HPI: Obesity: Doing well with phentermine, has lost 4 pounds since the last visit, unable to tolerate Topamax even one half tab for 2 weeks.  Past medical history, Surgical history, Family history not pertinant except as noted below, Social history, Allergies, and medications have been entered into the medical record, reviewed, and no changes needed.   Review of Systems: No fevers, chills, night sweats, weight loss, chest pain, or shortness of breath.   Objective:    General: Well Developed, well nourished, and in no acute distress.  Neuro: Alert and oriented x3, extra-ocular muscles intact, sensation grossly intact.  HEENT: Normocephalic, atraumatic, pupils equal round reactive to light, neck supple, no masses, no lymphadenopathy, thyroid nonpalpable.  Skin: Warm and dry, no rashes. Cardiac: Regular rate and rhythm, no murmurs rubs or gallops, no lower extremity edema.  Respiratory: Clear to auscultation bilaterally. Not using accessory muscles, speaking in full sentences. Impression and Recommendations:

## 2012-09-10 ENCOUNTER — Ambulatory Visit (INDEPENDENT_AMBULATORY_CARE_PROVIDER_SITE_OTHER): Payer: BC Managed Care – PPO | Admitting: Sports Medicine

## 2012-09-10 ENCOUNTER — Encounter: Payer: Self-pay | Admitting: Sports Medicine

## 2012-09-10 VITALS — BP 123/84 | HR 101 | Wt 208.0 lb

## 2012-09-10 DIAGNOSIS — K5900 Constipation, unspecified: Secondary | ICD-10-CM

## 2012-09-10 DIAGNOSIS — E669 Obesity, unspecified: Secondary | ICD-10-CM

## 2012-09-10 DIAGNOSIS — K5904 Chronic idiopathic constipation: Secondary | ICD-10-CM | POA: Insufficient documentation

## 2012-09-10 MED ORDER — PHENTERMINE HCL 37.5 MG PO CAPS
37.5000 mg | ORAL_CAPSULE | ORAL | Status: DC
Start: 2012-09-10 — End: 2012-10-09

## 2012-09-10 MED ORDER — LUBIPROSTONE 8 MCG PO CAPS: 8.0000 ug | ORAL_CAPSULE | Freq: Two times a day (BID) | ORAL | Status: DC

## 2012-09-10 NOTE — Assessment & Plan Note (Addendum)
Well-controlled with Senokot-S. She can't continue this indefinitely, adding on Amitiza at 8 mcg. If insufficient response after a couple of weeks we can certainly increase to 24 mcg twice a day.

## 2012-09-10 NOTE — Assessment & Plan Note (Signed)
1 pound weight loss since the last month. Refilling phentermine for now. Inadequate response and too many adverse effects to Topamax. Return in one month for a weight check and refills.

## 2012-09-10 NOTE — Progress Notes (Signed)
  Subjective:    CC: Followup  HPI: Obesity: Has lost 1 pound since her last weight loss visit. Due for refill, we tried Topamax, she was intolerant.  Constipation: Doing well with Senokot S., needs something more long term.  Past medical history, Surgical history, Family history not pertinant except as noted below, Social history, Allergies, and medications have been entered into the medical record, reviewed, and no changes needed.   Review of Systems: No fevers, chills, night sweats, weight loss, chest pain, or shortness of breath.   Objective:    General: Well Developed, well nourished, and in no acute distress.  Neuro: Alert and oriented x3, extra-ocular muscles intact, sensation grossly intact.  HEENT: Normocephalic, atraumatic, pupils equal round reactive to light, neck supple, no masses, no lymphadenopathy, thyroid nonpalpable.  Skin: Warm and dry, no rashes. Cardiac: Regular rate and rhythm, no murmurs rubs or gallops, no lower extremity edema.  Respiratory: Clear to auscultation bilaterally. Not using accessory muscles, speaking in full sentences.  Impression and Recommendations:

## 2012-10-09 ENCOUNTER — Ambulatory Visit (INDEPENDENT_AMBULATORY_CARE_PROVIDER_SITE_OTHER): Payer: BC Managed Care – PPO | Admitting: Sports Medicine

## 2012-10-09 ENCOUNTER — Encounter: Payer: Self-pay | Admitting: Sports Medicine

## 2012-10-09 VITALS — BP 123/81 | HR 86 | Wt 208.0 lb

## 2012-10-09 DIAGNOSIS — E669 Obesity, unspecified: Secondary | ICD-10-CM

## 2012-10-09 DIAGNOSIS — K5900 Constipation, unspecified: Secondary | ICD-10-CM

## 2012-10-09 DIAGNOSIS — K5904 Chronic idiopathic constipation: Secondary | ICD-10-CM

## 2012-10-09 MED ORDER — LORCASERIN HCL 10 MG PO TABS: 1.0000 | ORAL_TABLET | Freq: Two times a day (BID) | ORAL | Status: DC

## 2012-10-09 MED ORDER — LINACLOTIDE 290 MCG PO CAPS: 290.0000 ug | ORAL_CAPSULE | Freq: Every day | ORAL | Status: DC

## 2012-10-09 NOTE — Assessment & Plan Note (Signed)
Into the Senokot-S as needed. Insufficient response to Amitiza at 24 mcg. Switching to Linzess. Return in 6 weeks.

## 2012-10-09 NOTE — Progress Notes (Signed)
  Subjective:    CC: Follow up  HPI: Obesity: Worsening, she has not lost a single pound in the past 2 months on phentermine, and had too many adverse effects on Topamax. She did see a nutritionist, tells me she has been following the dietary recommendations, and following exercise prescription. She wonders if there's anything else we can try.  Constipation, chronic idiopathic: Excellent response to Senokot-S, but really no response to Amitiza, like to switch to something else.  Past medical history, Surgical history, Family history not pertinant except as noted below, Social history, Allergies, and medications have been entered into the medical record, reviewed, and no changes needed.   Review of Systems: No fevers, chills, night sweats, weight loss, chest pain, or shortness of breath.   Objective:    General: Well Developed, well nourished, and in no acute distress.  Neuro: Alert and oriented x3, extra-ocular muscles intact, sensation grossly intact.  HEENT: Normocephalic, atraumatic, pupils equal round reactive to light, neck supple, no masses, no lymphadenopathy, thyroid nonpalpable.  Skin: Warm and dry, no rashes. Cardiac: Regular rate and rhythm, no murmurs rubs or gallops, no lower extremity edema.  Respiratory: Clear to auscultation bilaterally. Not using accessory muscles, speaking in full sentences. Abdomen: Soft, nontender, nondistended, normal bowel sounds, no palpable masses.  Impression and Recommendations:

## 2012-10-09 NOTE — Assessment & Plan Note (Signed)
No response to phentermine, Topamax, and nutrition referral. We are stopping phentermine, I'm going to try to [facility]

## 2012-10-12 ENCOUNTER — Other Ambulatory Visit: Payer: Self-pay | Admitting: Sports Medicine

## 2012-10-12 DIAGNOSIS — E669 Obesity, unspecified: Secondary | ICD-10-CM

## 2012-10-12 MED ORDER — LORCASERIN HCL 10 MG PO TABS: 1.0000 | ORAL_TABLET | Freq: Two times a day (BID) | ORAL | Status: DC

## 2012-11-09 ENCOUNTER — Ambulatory Visit (INDEPENDENT_AMBULATORY_CARE_PROVIDER_SITE_OTHER): Payer: BC Managed Care – PPO | Admitting: Sports Medicine

## 2012-11-09 ENCOUNTER — Encounter: Payer: Self-pay | Admitting: Sports Medicine

## 2012-11-09 VITALS — BP 131/83 | HR 83 | Wt 210.0 lb

## 2012-11-09 DIAGNOSIS — M25569 Pain in unspecified knee: Secondary | ICD-10-CM

## 2012-11-09 DIAGNOSIS — M222X9 Patellofemoral disorders, unspecified knee: Secondary | ICD-10-CM | POA: Insufficient documentation

## 2012-11-09 DIAGNOSIS — E669 Obesity, unspecified: Secondary | ICD-10-CM

## 2012-11-09 MED ORDER — LORCASERIN HCL 10 MG PO TABS: 1.0000 | ORAL_TABLET | Freq: Two times a day (BID) | ORAL | Status: DC

## 2012-11-09 NOTE — Assessment & Plan Note (Signed)
Continue Tylenol. Continue knee sleeve. Patellofemoral rehabilitation exercises. Return in 4 weeks, we can consider an injection and formal physical therapy if no better.

## 2012-11-09 NOTE — Assessment & Plan Note (Signed)
She has recently started her period, and normally gains a few pounds, I will refill the Belviq, her next appointment will be scheduled away from her period.

## 2012-11-09 NOTE — Progress Notes (Signed)
  Subjective:    CC: Follow  HPI: Obesity: Failed phentermine, Topamax, she has gained 2 pounds since starting Belviq.  She does tell me that she is on her cycle and typically gains a few pounds. She desires that I refill it, and we set the followup visit away from her cycle.  Bilateral knee pain: Localized under the kneecaps, worse when going up and down stairs, endocrine, symptoms are mild, persistent, no radiation.  Past medical history, Surgical history, Family history not pertinant except as noted below, Social history, Allergies, and medications have been entered into the medical record, reviewed, and no changes needed.   Review of Systems: No fevers, chills, night sweats, weight loss, chest pain, or shortness of breath.   Objective:    General: Well Developed, well nourished, and in no acute distress.  Neuro: Alert and oriented x3, extra-ocular muscles intact, sensation grossly intact.  HEENT: Normocephalic, atraumatic, pupils equal round reactive to light, neck supple, no masses, no lymphadenopathy, thyroid nonpalpable.  Skin: Warm and dry, no rashes. Cardiac: Regular rate and rhythm, no murmurs rubs or gallops, no lower extremity edema.  Respiratory: Clear to auscultation bilaterally. Not using accessory muscles, speaking in full sentences. Bilateral Knee: Normal to inspection with no erythema or effusion or obvious bony abnormalities. Palpation normal with no warmth, joint line tenderness, patellar tenderness, or condyle tenderness. ROM full in flexion and extension and lower leg rotation. Ligaments with solid consistent endpoints including ACL, PCL, LCL, MCL. Negative Mcmurray's, Apley's, and Thessalonian tests. Painful patellar compression with crepitus. Patellar and quadriceps tendons unremarkable. Hamstring and quadriceps strength is normal.  Impression and Recommendations:

## 2012-11-18 ENCOUNTER — Ambulatory Visit (INDEPENDENT_AMBULATORY_CARE_PROVIDER_SITE_OTHER): Payer: BC Managed Care – PPO | Admitting: Sports Medicine

## 2012-11-18 VITALS — BP 143/82 | HR 98 | Wt 210.0 lb

## 2012-11-18 DIAGNOSIS — M25569 Pain in unspecified knee: Secondary | ICD-10-CM

## 2012-11-18 DIAGNOSIS — M222X9 Patellofemoral disorders, unspecified knee: Secondary | ICD-10-CM

## 2012-11-18 NOTE — Progress Notes (Signed)
Subjective:    CC: Knee pain  HPI: This is a 37 year old female with a history of patellofemoral syndrome who presents with increased knee pain on the right. This new pain began yesterday after her regular 3-mile treadmill workout. There is a dull pain behind the knee that extends below to the mid-calf. The pain becomes sharp with weight-bearing and rotation. The pain kept her up last night. She denies any swelling or erythema. She also continues to have bilateral anterior knee pain. She has not been completing her home physical therapy exercises. She wears a knee brace mainly at night. She has tried ibuprofen for the pain but says it has not helped. Pain is moderate, persistent, radiates as above.  Past medical history, Surgical history, Family history not pertinant except as noted below, Social history, Allergies, and medications have been entered into the medical record, reviewed, and no changes needed.   Review of Systems: No fevers, chills, night sweats, weight loss, chest pain, or shortness of breath.   Objective:    General: Well Developed, well nourished, and in no acute distress.  Neuro: Alert and oriented x3, extra-ocular muscles intact, sensation grossly intact.  HEENT: Normocephalic, atraumatic, pupils equal round reactive to light, neck supple, no masses, no lymphadenopathy, thyroid nonpalpable.  Skin: Warm and dry, no rashes. Cardiac: Regular rate and rhythm, no murmurs rubs or gallops, no lower extremity edema.  Respiratory: Clear to auscultation bilaterally. Not using accessory muscles, speaking in full sentences. Right Knee: Normal to inspection with no erythema or effusion or obvious bony abnormalities. Tender to palpation at medial and lateral joint lines. ROM full in flexion and extension and lower leg rotation. Ligaments with solid consistent endpoints including ACL, PCL, LCL, MCL. Negative Mcmurray's, Apley's, and Thessalonian tests. Non painful patellar  compression. Patellar glide without crepitus. Patellar and quadriceps tendons unremarkable. Hamstring and quadriceps strength is normal.   Procedure: Real-time Ultrasound Guided Injection of left knee Device: GE Logiq E  Verbal informed consent obtained.  Time-out conducted.  Noted no overlying erythema, induration, or other signs of local infection.  Skin prepped in a sterile fashion.  Local anesthesia: Topical Ethyl chloride.  With sterile technique and under real time ultrasound guidance:  2 cc Kenalog 40, 4 cc lidocaine injected easily into the suprapatellar recess. Completed without difficulty  Pain immediately resolved suggesting accurate placement of the medication.  Advised to call if fevers/chills, erythema, induration, drainage, or persistent bleeding.  Images permanently stored and available for review in the ultrasound unit.  Impression: Technically successful ultrasound guided injection.  Procedure: Real-time Ultrasound Guided Injection of right knee Device: GE Logiq E  Verbal informed consent obtained.  Time-out conducted.  Noted no overlying erythema, induration, or other signs of local infection.  Skin prepped in a sterile fashion.  Local anesthesia: Topical Ethyl chloride.  With sterile technique and under real time ultrasound guidance:  2 cc Kenalog 40, 4 cc lidocaine injected easily into the suprapatellar recess. Completed without difficulty  Pain immediately resolved suggesting accurate placement of the medication.  Advised to call if fevers/chills, erythema, induration, drainage, or persistent bleeding.  Images permanently stored and available for review in the ultrasound unit.  Impression: Technically successful ultrasound guided injection.  Impression and Recommendations:   Assessment: This is a 37 year old female whose knee pain is likely due to worsening of her patellofemoral syndrome.  Plan: 1. Bilateral knee injections today 2. Refer to formal physical  therapy 3. Bilateral knee X-rays  This note was originally written by Alan Ripper  Ashleymarie Granderson MS3.

## 2012-11-18 NOTE — Assessment & Plan Note (Signed)
Continue oral analgesics, knee sleeve Injected  Left and right knees as above. Formal physical therapy as she has not been entirely compliant with her home exercises.

## 2012-11-30 ENCOUNTER — Ambulatory Visit: Payer: BC Managed Care – PPO | Admitting: Physical Therapy

## 2012-11-30 DIAGNOSIS — M6281 Muscle weakness (generalized): Secondary | ICD-10-CM

## 2012-11-30 DIAGNOSIS — M25569 Pain in unspecified knee: Secondary | ICD-10-CM

## 2012-12-03 ENCOUNTER — Other Ambulatory Visit: Payer: Self-pay

## 2012-12-07 ENCOUNTER — Encounter: Payer: BC Managed Care – PPO | Admitting: Physical Therapy

## 2012-12-07 ENCOUNTER — Ambulatory Visit: Payer: BC Managed Care – PPO | Admitting: Sports Medicine

## 2012-12-14 ENCOUNTER — Encounter: Payer: BC Managed Care – PPO | Admitting: Physical Therapy

## 2012-12-16 ENCOUNTER — Encounter: Payer: Self-pay | Admitting: Sports Medicine

## 2012-12-16 ENCOUNTER — Ambulatory Visit (INDEPENDENT_AMBULATORY_CARE_PROVIDER_SITE_OTHER): Payer: BC Managed Care – PPO | Admitting: Sports Medicine

## 2012-12-16 VITALS — BP 133/82 | HR 90 | Wt 210.0 lb

## 2012-12-16 DIAGNOSIS — M25569 Pain in unspecified knee: Secondary | ICD-10-CM

## 2012-12-16 DIAGNOSIS — E669 Obesity, unspecified: Secondary | ICD-10-CM

## 2012-12-16 DIAGNOSIS — M222X9 Patellofemoral disorders, unspecified knee: Secondary | ICD-10-CM

## 2012-12-16 NOTE — Assessment & Plan Note (Signed)
So far failed phentermine, Topamax, and Lorcaserin. Continue dietary measures and exercise prescription.

## 2012-12-16 NOTE — Assessment & Plan Note (Signed)
During extremely well after bilateral injection. She is going to work extensively her hip abductors, as was recommended by physical therapy which I think is appropriate. Return as needed for this. I have advised that she avoid deep knee flexion in her workout regimen.

## 2012-12-16 NOTE — Progress Notes (Signed)
  Subjective:    CC: Followup  HPI: Bilateral patellofemoral pain: Doing well after injection and physical therapy. Pain-free.  Obesity: Has now failed multiple weight loss medications, she is doing some home exercises which is helping her get a muscle in with that. She is very happy with her results so far.  Past medical history, Surgical history, Family history not pertinant except as noted below, Social history, Allergies, and medications have been entered into the medical record, reviewed, and no changes needed.   Review of Systems: No fevers, chills, night sweats, weight loss, chest pain, or shortness of breath.   Objective:    General: Well Developed, well nourished, and in no acute distress.  Neuro: Alert and oriented x3, extra-ocular muscles intact, sensation grossly intact.  HEENT: Normocephalic, atraumatic, pupils equal round reactive to light, neck supple, no masses, no lymphadenopathy, thyroid nonpalpable.  Skin: Warm and dry, no rashes. Cardiac: Regular rate and rhythm, no murmurs rubs or gallops, no lower extremity edema.  Respiratory: Clear to auscultation bilaterally. Not using accessory muscles, speaking in full sentences.  Impression and Recommendations:

## 2012-12-21 ENCOUNTER — Encounter: Payer: BC Managed Care – PPO | Admitting: Physical Therapy

## 2013-03-18 ENCOUNTER — Ambulatory Visit: Payer: BC Managed Care – PPO | Admitting: Sports Medicine

## 2013-03-25 ENCOUNTER — Ambulatory Visit: Payer: BC Managed Care – PPO | Admitting: Sports Medicine

## 2013-08-13 ENCOUNTER — Encounter: Payer: Self-pay | Admitting: Sports Medicine

## 2013-08-13 ENCOUNTER — Ambulatory Visit (INDEPENDENT_AMBULATORY_CARE_PROVIDER_SITE_OTHER): Payer: BC Managed Care – PPO | Admitting: Sports Medicine

## 2013-08-13 VITALS — BP 134/84 | HR 95 | Temp 101.6°F | Ht 64.0 in | Wt 217.0 lb

## 2013-08-13 DIAGNOSIS — J029 Acute pharyngitis, unspecified: Secondary | ICD-10-CM | POA: Insufficient documentation

## 2013-08-13 LAB — POCT RAPID STREP A (OFFICE): Rapid Strep A Screen: NEGATIVE

## 2013-08-13 MED ORDER — IBUPROFEN 800 MG PO TABS
800.0000 mg | ORAL_TABLET | Freq: Three times a day (TID) | ORAL | Status: DC | PRN
Start: 2013-08-13 — End: 2017-01-14

## 2013-08-13 MED ORDER — KETOROLAC TROMETHAMINE 30 MG/ML IJ SOLN
30.0000 mg | Freq: Once | INTRAMUSCULAR | Status: AC
  Administered 2013-08-13: 30 mg via INTRAMUSCULAR

## 2013-08-13 MED ORDER — LIDOCAINE VISCOUS HCL 2 % MT SOLN: OROMUCOSAL | Status: DC

## 2013-08-13 NOTE — Assessment & Plan Note (Signed)
Negative strep test. Portal 30 mg intramuscular, prescription strength ibuprofen, oral viscous lidocaine. Return to see me if no better in 2 weeks.

## 2013-08-13 NOTE — Progress Notes (Signed)
  Subjective:    CC: Sore throat  HPI: This is a pleasant 38 year old female, she awoke this morning with a severe sore throat, without any muscle aches, body aches, shortness of breath, cough, she does have some sneezing. No nausea, vomiting, skin rashes. No chest pain. Symptoms are moderate, persistent.  Past medical history, Surgical history, Family history not pertinant except as noted below, Social history, Allergies, and medications have been entered into the medical record, reviewed, and no changes needed.   Review of Systems: No fevers, chills, night sweats, weight loss, chest pain, or shortness of breath.   Objective:    General: Well Developed, well nourished, and in no acute distress.  Neuro: Alert and oriented x3, extra-ocular muscles intact, sensation grossly intact.  HEENT: Normocephalic, atraumatic, pupils equal round reactive to light, neck supple, no masses, no lymphadenopathy, thyroid nonpalpable. Oropharynx is only slightly erythematous, no tonsillar exudates or erythema, no soft palate lesions, nasopharynx and external ear canals are unremarkable. Skin: Warm and dry, no rashes. Cardiac: Regular rate and rhythm, no murmurs rubs or gallops, no lower extremity edema.  Respiratory: Clear to auscultation bilaterally. Not using accessory muscles, speaking in full sentences.  Rapid strep test is negative.  Impression and Recommendations:

## 2013-10-26 ENCOUNTER — Encounter: Payer: Self-pay | Admitting: Sports Medicine

## 2013-10-26 ENCOUNTER — Ambulatory Visit (INDEPENDENT_AMBULATORY_CARE_PROVIDER_SITE_OTHER): Payer: BC Managed Care – PPO | Admitting: Sports Medicine

## 2013-10-26 VITALS — BP 124/81 | HR 75 | Ht 64.0 in | Wt 216.0 lb

## 2013-10-26 DIAGNOSIS — Z0000 Encounter for general adult medical examination without abnormal findings: Secondary | ICD-10-CM

## 2013-10-26 DIAGNOSIS — Z23 Encounter for immunization: Secondary | ICD-10-CM | POA: Diagnosis not present

## 2013-10-26 NOTE — Progress Notes (Signed)
  Subjective:    CC: Complete physical  HPI:  Breanna [name] is here for her physical exam, she is up-to-date on cervical cancer screening, she needs a flu shot. No complaints.  Past medical history, Surgical history, Family history not pertinant except as noted below, Social history, Allergies, and medications have been entered into the medical record, reviewed, and no changes needed.   Review of Systems: No headache, visual changes, nausea, vomiting, diarrhea, constipation, dizziness, abdominal pain, skin rash, fevers, chills, night sweats, swollen lymph nodes, weight loss, chest pain, body aches, joint swelling, muscle aches, shortness of breath, mood changes, visual or auditory hallucinations.  Objective:    General: Well Developed, well nourished, and in no acute distress.  Neuro: Alert and oriented x3, extra-ocular muscles intact, sensation grossly intact. Cranial nerves II through XII are intact, motor, sensory, and coordinative functions are all intact. HEENT: Normocephalic, atraumatic, pupils equal round reactive to light, neck supple, no masses, no lymphadenopathy, thyroid nonpalpable. Oropharynx, nasopharynx, external ear canals are unremarkable. Skin: Warm and dry, no rashes noted.  Cardiac: Regular rate and rhythm, no murmurs rubs or gallops.  Respiratory: Clear to auscultation bilaterally. Not using accessory muscles, speaking in full sentences.  Abdominal: Soft, nontender, nondistended, positive bowel sounds, no masses, no organomegaly.  Musculoskeletal: Shoulder, elbow, wrist, hip, knee, ankle stable, and with full range of motion.  Impression and Recommendations:    The patient was counselled, risk factors were discussed, anticipatory guidance given.

## 2013-10-26 NOTE — Assessment & Plan Note (Signed)
Up-to-date on cervical cancer screening, flu shot today. Some perimenopausal symptoms, checking a fertility panel. She is in the luteal phase of her cycle (8 days till next menstrual cycle).

## 2013-10-27 LAB — LUTEINIZING HORMONE: LH: 4.7 m[IU]/mL

## 2013-10-27 LAB — PROGESTERONE: Progesterone: 10.8 ng/mL

## 2013-10-27 LAB — TSH: TSH: 1.214 u[IU]/mL (ref 0.350–4.500)

## 2013-10-27 LAB — FOLLICLE STIMULATING HORMONE: FSH: 3.1 m[IU]/mL

## 2013-10-31 LAB — ESTROGENS, TOTAL: Estrogen: 263 pg/mL

## 2014-04-15 ENCOUNTER — Ambulatory Visit (INDEPENDENT_AMBULATORY_CARE_PROVIDER_SITE_OTHER): Payer: BLUE CROSS/BLUE SHIELD | Admitting: Sports Medicine

## 2014-04-15 ENCOUNTER — Encounter: Payer: Self-pay | Admitting: Sports Medicine

## 2014-04-15 VITALS — BP 107/69 | HR 93 | Wt 218.0 lb

## 2014-04-15 DIAGNOSIS — G5602 Carpal tunnel syndrome, left upper limb: Secondary | ICD-10-CM | POA: Diagnosis not present

## 2014-04-15 DIAGNOSIS — G5601 Carpal tunnel syndrome, right upper limb: Secondary | ICD-10-CM

## 2014-04-15 DIAGNOSIS — G5603 Carpal tunnel syndrome, bilateral upper limbs: Secondary | ICD-10-CM | POA: Insufficient documentation

## 2014-04-15 NOTE — Assessment & Plan Note (Signed)
Bilateral ultrasound guided median nerve hydrodissection per patient request. Bilateral nighttime wrist splinting. Return to see me in 4 weeks.

## 2014-04-15 NOTE — Progress Notes (Signed)
Subjective:    CC:  Hand numbness  HPI: Patient presents with complaint of 1 week of increasing numbness and tingling in bilateral palms, worse on the left than the right. The sensation of pins and needles is worst in the left third finger, but she denies frank pain or burning. 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 She uses gel wrist pads when typing. She has been more active lately and helped her husband with raking and other yard work this past weekend. She is unaware of particular exacerbating factors. She has tried taking ibuprofen which has not alleviated her symptoms. The symptoms do not wake her up at night. She denied pain in arms or neck.  Past medical history, Surgical history, Family history not pertinant except as noted below, Social history, Allergies, and medications have been entered into the medical record, reviewed, and no changes needed.   Review of Systems: No fevers, chills, night sweats, weight loss, chest pain, or shortness of breath.   Objective:    General: Well Developed, well nourished, and in no acute distress.  Neuro: Alert and oriented x3, extra-ocular muscles intact, sensation grossly intact.  HEENT: Normocephalic, atraumatic, pupils equal round reactive to light, neck supple, no masses, no lymphadenopathy, thyroid nonpalpable.  Skin: Warm and dry, no rashes. Cardiac: Radial pulses 2+, no lower extremity edema.  Respiratory: Not using accessory muscles, speaking in full sentences. MSK: Wrists: Inspection normal with no visible erythema or swelling. ROM smooth and normal with good flexion and extension and ulnar/radial deviation that is symmetrical with opposite wrist. Palpation is normal over metacarpals, navicular, lunate, and TFCC; tendons without tenderness/ swelling No snuffbox tenderness. No tenderness over Canal of Guyon. Strength 5/5 in all directions without pain. Tinel's and phalens tests positive bilaterally. Negative  Watson's test.  Procedure: Real-time Ultrasound Guided hydrodissection of median nerve in the right carpal tunnel Device: GE Logiq E  Verbal informed consent obtained.  Time-out conducted.  Noted no overlying erythema, induration, or other signs of local infection.  Skin prepped in a sterile fashion.  Local anesthesia: Topical Ethyl chloride.  With sterile technique and under real time ultrasound guidance: Using a 25-gauge needle I injected a total of 1 mL kenalog 40, 4 mL lidocaine both superficial to and deep to the median nerve in the carpal tunnel taking care to avoid the radial and ulnar arteries. Medication was also injected deeper into the carpal tunnel.  Completed without difficulty  Pain immediately resolved suggesting accurate placement of the medication.  Advised to call if fevers/chills, erythema, induration, drainage, or persistent bleeding.  Images permanently stored and available for review in the ultrasound unit.  Impression: Technically successful ultrasound guided injection.  Procedure: Real-time Ultrasound Guided hydrodissection of median nerve in the left carpal tunnel Device: GE Logiq E  Verbal informed consent obtained.  Time-out conducted.  Noted no overlying erythema, induration, or other signs of local infection.  Skin prepped in a sterile fashion.  Local anesthesia: Topical Ethyl chloride.  With sterile technique and under real time ultrasound guidance: Using a 25-gauge needle I injected a total of 1 mL kenalog 40, 4 mL lidocaine both superficial to and deep to the median nerve in the carpal tunnel taking care to avoid the radial and ulnar arteries. Medication was also injected deeper into the carpal tunnel.  Completed without difficulty  Pain immediately resolved suggesting accurate placement of the medication.  Advised to call if fevers/chills, erythema, induration, drainage, or persistent bleeding.  Images permanently stored and available for review in the  ultrasound unit.  Impression: Technically successful ultrasound guided injection.  Impression and Recommendations:    # Carpal Tunnel Syndrome, Bilateral - Patient with bilateral numbness in the distribution of median nerve  - Patient may continue ibuprofen as needed for pain - Ultrasound guided steroid hydrodissection of the median nerve was performed bilaterally (see procedure note) - Patient instructed to wear carpal tunnel extension splints on both wrists at night   Follow up in 4 weeks or sooner as needed

## 2014-05-12 ENCOUNTER — Encounter: Payer: Self-pay | Admitting: Sports Medicine

## 2014-05-12 ENCOUNTER — Ambulatory Visit (INDEPENDENT_AMBULATORY_CARE_PROVIDER_SITE_OTHER): Payer: BLUE CROSS/BLUE SHIELD | Admitting: Sports Medicine

## 2014-05-12 VITALS — BP 128/80 | HR 104 | Wt 216.0 lb

## 2014-05-12 DIAGNOSIS — G5601 Carpal tunnel syndrome, right upper limb: Secondary | ICD-10-CM

## 2014-05-12 DIAGNOSIS — K5904 Chronic idiopathic constipation: Secondary | ICD-10-CM

## 2014-05-12 DIAGNOSIS — G5603 Carpal tunnel syndrome, bilateral upper limbs: Secondary | ICD-10-CM

## 2014-05-12 DIAGNOSIS — M222X9 Patellofemoral disorders, unspecified knee: Secondary | ICD-10-CM | POA: Diagnosis not present

## 2014-05-12 DIAGNOSIS — E669 Obesity, unspecified: Secondary | ICD-10-CM | POA: Diagnosis not present

## 2014-05-12 DIAGNOSIS — G5602 Carpal tunnel syndrome, left upper limb: Secondary | ICD-10-CM

## 2014-05-12 DIAGNOSIS — K5900 Constipation, unspecified: Secondary | ICD-10-CM | POA: Diagnosis not present

## 2014-05-12 MED ORDER — LINACLOTIDE 145 MCG PO CAPS: 145.0000 ug | ORAL_CAPSULE | Freq: Every day | ORAL | Status: DC

## 2014-05-12 MED ORDER — LIRAGLUTIDE -WEIGHT MANAGEMENT 18 MG/3ML ~~LOC~~ SOPN: 3.0000 mg | PEN_INJECTOR | Freq: Every day | SUBCUTANEOUS | Status: DC

## 2014-05-12 NOTE — Assessment & Plan Note (Signed)
Has failed phentermine, Topamax, Belviq, and is on a physician directed exercise program. We are going to start Saxenda.

## 2014-05-12 NOTE — Patient Instructions (Signed)
Hip Rehabilitation Protocol:  1.  Side leg raises.  3x30 with no weight, then 3x15 with 2 lb ankle weight, then 3x15 with 5 lb ankle weight 

## 2014-05-12 NOTE — Assessment & Plan Note (Signed)
Good response to Linzess however developed diarrhea at the high dose. Decrease in to 145 mg.

## 2014-05-12 NOTE — Assessment & Plan Note (Signed)
Hip abductor rehabilitation for 8 weeks. Return a weeks, injection if no better.

## 2014-05-12 NOTE — Progress Notes (Signed)
  Subjective:    CC: Multiple issues  HPI: Obesity: With bilateral knee pain, has failed several oral medications including Belviq, phentermine, she is a physician directed exercise program with persistent difficulty losing weight.  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  Bilateral carpal tunnel syndrome: Completely resolved now after bilateral median nerve hydrodissection at the last visit.  Past medical history, Surgical history, Family history not pertinant except as noted below, Social history, Allergies, and medications have been entered into the medical record, reviewed, and no changes needed.   Review of Systems: No fevers, chills, night sweats, weight loss, chest pain, or shortness of breath.   Objective:    General: Well Developed, well nourished, and in no acute distress.  Neuro: Alert and oriented x3, extra-ocular muscles intact, sensation grossly intact.  HEENT: Normocephalic, atraumatic, pupils equal round reactive to light, neck supple, no masses, no lymphadenopathy, thyroid nonpalpable.  Skin: Warm and dry, no rashes. Cardiac: Regular rate and rhythm, no murmurs rubs or gallops, no lower extremity edema.  Respiratory: Clear to auscultation bilaterally. Not using accessory muscles, speaking in full sentences. Hips: Abductor's are significantly weak bilaterally.  Impression and Recommendations:

## 2014-05-12 NOTE — Assessment & Plan Note (Signed)
Resolved after injections 

## 2014-05-13 ENCOUNTER — Encounter: Payer: Self-pay | Admitting: Sports Medicine

## 2014-05-18 ENCOUNTER — Telehealth: Payer: Self-pay

## 2014-05-18 MED ORDER — LIRAGLUTIDE 18 MG/3ML ~~LOC~~ SOPN: PEN_INJECTOR | SUBCUTANEOUS | Status: DC

## 2014-05-18 NOTE — Telephone Encounter (Signed)
Kimberley from [facility] called; she states the Victoza is given a total of 1.8 mg daily and wanted to check the strength of the last prescription. Is it 1.8 mg or 3 mg?

## 2014-05-19 NOTE — Telephone Encounter (Signed)
Pharmacy advised  

## 2014-05-19 NOTE — Telephone Encounter (Signed)
There is an up titration regimen on the sig, did she get that? Also I'm using Victoza but I'm going to be dosing it like Saxenda.

## 2014-05-25 ENCOUNTER — Encounter: Payer: Self-pay | Admitting: Sports Medicine

## 2014-05-25 MED ORDER — INSULIN PEN NEEDLE 32G X 6 MM MISC: Status: DC

## 2014-06-07 ENCOUNTER — Encounter: Payer: Self-pay | Admitting: Sports Medicine

## 2014-07-07 ENCOUNTER — Ambulatory Visit (INDEPENDENT_AMBULATORY_CARE_PROVIDER_SITE_OTHER): Payer: BLUE CROSS/BLUE SHIELD | Admitting: Sports Medicine

## 2014-07-07 ENCOUNTER — Encounter: Payer: Self-pay | Admitting: Sports Medicine

## 2014-07-07 DIAGNOSIS — E669 Obesity, unspecified: Secondary | ICD-10-CM

## 2014-07-07 MED ORDER — LIRAGLUTIDE 18 MG/3ML ~~LOC~~ SOPN: PEN_INJECTOR | SUBCUTANEOUS | Status: DC

## 2014-07-07 NOTE — Progress Notes (Signed)
  Subjective:    CC:  Weight check  HPI: This is a pleasant 39 year old female, we've tried multiple medications including Belviq, phentermine, contracted, nothing helped her lose weight. More recently we had Saxenda, after the first month she's lost 14 pounds, doing extremely well, currently at 1.2 mg. Has been battling constipation, over-the-counter stool softeners have not been sufficient and Linzess even at 145 g was too potent. She'll continue to play around with different medications until she finds one that works for her, I have encouraged increasing her fiber intake.  Knee pain: Resolved with weight loss.  Past medical history, Surgical history, Family history not pertinant except as noted below, Social history, Allergies, and medications have been entered into the medical record, reviewed, and no changes needed.   Review of Systems: No fevers, chills, night sweats, weight loss, chest pain, or shortness of breath.   Objective:    General: Well Developed, well nourished, and in no acute distress.  Neuro: Alert and oriented x3, extra-ocular muscles intact, sensation grossly intact.  HEENT: Normocephalic, atraumatic, pupils equal round reactive to light, neck supple, no masses, no lymphadenopathy, thyroid nonpalpable.  Skin: Warm and dry, no rashes. Cardiac: Regular rate and rhythm, no murmurs rubs or gallops, no lower extremity edema.  Respiratory: Clear to auscultation bilaterally. Not using accessory muscles, speaking in full sentences.  Impression and Recommendations:

## 2014-07-07 NOTE — Assessment & Plan Note (Signed)
To continue weight loss, 12 pounds. Refilling medication, she uses approximately 4 pens per month.

## 2014-07-10 MED ORDER — LIRAGLUTIDE 18 MG/3ML ~~LOC~~ SOPN: PEN_INJECTOR | SUBCUTANEOUS | Status: DC

## 2014-08-09 ENCOUNTER — Ambulatory Visit (INDEPENDENT_AMBULATORY_CARE_PROVIDER_SITE_OTHER): Payer: BLUE CROSS/BLUE SHIELD | Admitting: Sports Medicine

## 2014-08-09 ENCOUNTER — Encounter: Payer: Self-pay | Admitting: Sports Medicine

## 2014-08-09 ENCOUNTER — Ambulatory Visit (HOSPITAL_BASED_OUTPATIENT_CLINIC_OR_DEPARTMENT_OTHER)
Admission: RE | Admit: 2014-08-09 | Discharge: 2014-08-09 | Disposition: A | Discharge status: Home / Self Care | Payer: BLUE CROSS/BLUE SHIELD | Source: Ambulatory Visit | Attending: Sports Medicine | Admitting: Sports Medicine

## 2014-08-09 VITALS — BP 121/77 | HR 105 | Temp 98.8°F | Wt 204.0 lb

## 2014-08-09 DIAGNOSIS — E042 Nontoxic multinodular goiter: Secondary | ICD-10-CM | POA: Insufficient documentation

## 2014-08-09 DIAGNOSIS — E669 Obesity, unspecified: Secondary | ICD-10-CM

## 2014-08-09 DIAGNOSIS — E049 Nontoxic goiter, unspecified: Secondary | ICD-10-CM | POA: Insufficient documentation

## 2014-08-09 DIAGNOSIS — R221 Localized swelling, mass and lump, neck: Secondary | ICD-10-CM | POA: Diagnosis present

## 2014-08-09 MED ORDER — LIRAGLUTIDE 18 MG/3ML ~~LOC~~ SOPN: PEN_INJECTOR | SUBCUTANEOUS | Status: DC

## 2014-08-09 NOTE — Progress Notes (Signed)
  Subjective:    CC: Neck mass  HPI: For the past several weeks Breanna [name] has had a mass on the anterior lower neck, no symptoms, no cold intolerance, she does have a bit of difficulty swallowing that is subjective, no constipation, no fatigue.  Past medical history, Surgical history, Family history not pertinant except as noted below, Social history, Allergies, and medications have been entered into the medical record, reviewed, and no changes needed.   Review of Systems: No fevers, chills, night sweats, weight loss, chest pain, or shortness of breath.   Objective:    General: Well Developed, well nourished, and in no acute distress.  Neuro: Alert and oriented x3, extra-ocular muscles intact, sensation grossly intact.  HEENT: Normocephalic, atraumatic, pupils equal round reactive to light, neck supple, palpable enlarged thyroid without any nodules, no lymphadenopathy, thyroid nonpalpable.  Skin: Warm and dry, no rashes. Cardiac: Regular rate and rhythm, no murmurs rubs or gallops, no lower extremity edema.  Respiratory: Clear to auscultation bilaterally. Not using accessory muscles, speaking in full sentences.  Impression and Recommendations:    I spent 25 minutes with this patient, greater than 50% was face-to-face time counseling regarding the above diagnoses

## 2014-08-09 NOTE — Patient Instructions (Signed)
Goiter Goiter is an enlarged thyroid gland. The thyroid gland sits at the base of the front of the neck. The gland produces hormones that regulate mood, body temperature, pulse rate, and digestion. Most goiters are painless and are not a cause for serious concern. Goiters and conditions that cause goiters can be treated if necessary.  CAUSES  Common causes of goiter include:  Graves disease (causes too much hormone to be produced [hyperthyroidism]).  Hashimoto disease (causes too little hormone to be produced [hypothyroidism]).  Thyroiditis (inflammation of the thyroid sometimes caused by virus or pregnancy).  Nodular goiter (small bumps form; sometimes called toxic nodular goiter).  Pregnancy.  Thyroid cancer (very few goiters with nodules are cancerous).  Certain medications.  Radiation exposure.  Iodine deficiency (more common in developing countries in inland populations). RISK FACTORS Risk factors for goiter include:  A family history of goiter.  Female gender.  Inadequate iodine in the diet.  Age older than 40 years. SYMPTOMS  Many goiters do not cause symptoms. When symptoms do occur, they may include:  Swelling in the lower part of the neck. This swelling can range from a very small bump to a large lump.  A tight feeling in the throat.  A hoarse voice. Less commonly, a goiter may result in:  Coughing.  Wheezing.  Difficulty swallowing.  Difficulty breathing.  Bulging neck veins.  Dizziness. When a goiter is the result of hyperthyroidism, symptoms may include:  Rapid or irregular heartbeat.  Sickness in your stomach (nausea).  Vomiting.  Diarrhea.  Shaking.  Irritable feeling.  Bulging eyes.  Weight loss.  Heat sensitivity.  Anxiety. When a goiter is the result of hypothyroidism, symptoms may include:  Tiredness.  Dry skin.  Constipation.  Weight gain.  Irregular menstrual cycle.  Depressed mood.  Sensitivity to  cold. DIAGNOSIS  Tests used to diagnose goiter include:  A physical exam.  Blood tests, including thyroid hormone levels and antibody testing.  Ultrasonography, computerized X-ray scan (computed tomography, CT) or computerized magnetic scan (magnetic resonance imaging, MRI).  Thyroid scan (imaging along with safe radioactive injection).  Tissue sample taken (biopsy) of nodules. This is sometimes done to confirm that the nodules are not cancerous. TREATMENT  Treatment will depend on the cause of the goiter. Treatment may include:  Monitoring. In some cases, no treatment is necessary, and your doctor will monitor your condition at regular checkups.  Medications and supplements. Thyroid medication (thyroid hormone replacement) is available for hyperthyroidism and hypothyroidism.  If inflammation is the cause, over-the-counter medication or steroid medication may be recommended.  Goiters caused by iodine deficiency can be treated with iodine supplements or changes in diet.  Radioactive iodine treatment. Radioactive iodine is injected into the blood. It travels to the thyroid gland, kills thyroid cells, and reduces the size of the gland. This is only used when the thyroid gland is overactive. Lifelong thyroid hormone medication is often necessary after this treatment.  Surgery. A procedure to remove all or part of the gland may be recommended in severe cases or when cancer is the cause. Hormones can be taken to replace the hormones normally produced by the thyroid. HOME CARE INSTRUCTIONS   Take medications as directed.  Follow your caregiver's recommendations for any dietary changes.  Follow up with your caregiver for further examination and testing, as directed. PREVENTION   If you have a family history of goiter, discuss screening with your doctor.  Make sure you are getting enough iodine in your diet.  Use   of iodized table salt can help prevent iodine deficiency. Document  Released: 07/04/2009 Document Revised: 05/31/2013 Document Reviewed: 07/04/2009 Exit[employer] Patient Information 2015 [employer]. This information is not intended to replace advice given to you by your health care provider. Make sure you discuss any questions you have with your health care provider.  

## 2014-08-09 NOTE — Assessment & Plan Note (Addendum)
Likely represents a benign goiter. Checking lab work, as well as a thyroid ultrasound.  Advised to use regular iodine salt instead of sea salt for meals. Of note, she is also on a GLP-1 agonist.

## 2014-08-10 ENCOUNTER — Telehealth: Payer: Self-pay | Admitting: Sports Medicine

## 2014-08-10 ENCOUNTER — Other Ambulatory Visit: Payer: Self-pay | Admitting: Sports Medicine

## 2014-08-10 DIAGNOSIS — E049 Nontoxic goiter, unspecified: Secondary | ICD-10-CM

## 2014-08-10 LAB — TSH: TSH: 0.969 u[IU]/mL (ref 0.350–4.500)

## 2014-08-10 LAB — T4, FREE: Free T4: 1.12 ng/dL (ref 0.80–1.80)

## 2014-08-10 LAB — THYROGLOBULIN ANTIBODY PANEL
Thyroglobulin Ab: <1 [IU]/mL (ref <2)
Thyroglobulin: 14.1 ng/mL (ref 2.8–40.9)
Thyroperoxidase Ab SerPl-aCnc: 1 [IU]/mL (ref <9)

## 2014-08-10 LAB — T3, FREE: T3, Free: 3.3 pg/mL (ref 2.3–4.2)

## 2014-08-10 NOTE — Telephone Encounter (Signed)
Pt called for results of tests.

## 2014-08-10 NOTE — Telephone Encounter (Signed)
[facility]

## 2014-08-12 LAB — THYROID STIMULATING IMMUNOGLOBULIN: TSI: 37 % baseline (ref <140)

## 2014-08-31 ENCOUNTER — Ambulatory Visit
Admission: RE | Admit: 2014-08-31 | Discharge: 2014-08-31 | Disposition: A | Discharge status: Home / Self Care | Payer: BLUE CROSS/BLUE SHIELD | Source: Ambulatory Visit | Attending: Sports Medicine | Admitting: Sports Medicine

## 2014-08-31 ENCOUNTER — Other Ambulatory Visit (HOSPITAL_COMMUNITY)
Admission: RE | Admit: 2014-08-31 | Discharge: 2014-08-31 | Disposition: A | Discharge status: Home / Self Care | Payer: BLUE CROSS/BLUE SHIELD | Source: Ambulatory Visit | Attending: Interventional Radiology | Admitting: Interventional Radiology

## 2014-08-31 DIAGNOSIS — E041 Nontoxic single thyroid nodule: Secondary | ICD-10-CM | POA: Insufficient documentation

## 2014-08-31 DIAGNOSIS — E049 Nontoxic goiter, unspecified: Secondary | ICD-10-CM

## 2014-09-06 ENCOUNTER — Ambulatory Visit (INDEPENDENT_AMBULATORY_CARE_PROVIDER_SITE_OTHER): Payer: BLUE CROSS/BLUE SHIELD | Admitting: Sports Medicine

## 2014-09-06 ENCOUNTER — Encounter: Payer: Self-pay | Admitting: Sports Medicine

## 2014-09-06 VITALS — BP 123/79 | HR 88 | Temp 98.3°F | Ht 64.0 in | Wt 204.0 lb

## 2014-09-06 DIAGNOSIS — E669 Obesity, unspecified: Secondary | ICD-10-CM | POA: Diagnosis not present

## 2014-09-06 DIAGNOSIS — E049 Nontoxic goiter, unspecified: Secondary | ICD-10-CM | POA: Diagnosis not present

## 2014-09-06 NOTE — Progress Notes (Signed)
  Subjective:    CC: Follow-up  HPI: Thyroid nodule: Biopsy results were benign  Obesity: Has done well with weight loss, has not come off of all oral medications and would like to continue diet and exercise.  Past medical history, Surgical history, Family history not pertinant except as noted below, Social history, Allergies, and medications have been entered into the medical record, reviewed, and no changes needed.   Review of Systems: No fevers, chills, night sweats, weight loss, chest pain, or shortness of breath.   Objective:    General: Well Developed, well nourished, and in no acute distress.  Neuro: Alert and oriented x3, extra-ocular muscles intact, sensation grossly intact.  HEENT: Normocephalic, atraumatic, pupils equal round reactive to light, neck supple, no masses, no lymphadenopathy, thyroid nonpalpable. Minimal bruising but reduction in the size of the goiter. Skin: Warm and dry, no rashes. Cardiac: Regular rate and rhythm, no murmurs rubs or gallops, no lower extremity edema.  Respiratory: Clear to auscultation bilaterally. Not using accessory muscles, speaking in full sentences.  Impression and Recommendations:

## 2014-09-06 NOTE — Assessment & Plan Note (Signed)
Ultrasound showed a nodule, nodule was biopsied and was benign.

## 2014-09-06 NOTE — Assessment & Plan Note (Signed)
Off of Victoza, has been able to maintain weight and wants to stick with her diet and exercise which is completely appropriate.

## 2015-02-13 ENCOUNTER — Ambulatory Visit: Payer: BLUE CROSS/BLUE SHIELD

## 2015-03-04 ENCOUNTER — Emergency Department
Admission: EM | Admit: 2015-03-04 | Discharge: 2015-03-04 | Disposition: A | Discharge status: Home / Self Care | Payer: Self-pay | Source: Home / Self Care | Attending: Family Medicine | Admitting: Family Medicine

## 2015-03-04 ENCOUNTER — Emergency Department (INDEPENDENT_AMBULATORY_CARE_PROVIDER_SITE_OTHER): Payer: Self-pay

## 2015-03-04 ENCOUNTER — Encounter: Payer: Self-pay | Admitting: Emergency Medicine

## 2015-03-04 DIAGNOSIS — J9801 Acute bronchospasm: Secondary | ICD-10-CM

## 2015-03-04 DIAGNOSIS — R05 Cough: Secondary | ICD-10-CM

## 2015-03-04 DIAGNOSIS — J209 Acute bronchitis, unspecified: Secondary | ICD-10-CM

## 2015-03-04 MED ORDER — BENZONATATE 200 MG PO CAPS
200.0000 mg | ORAL_CAPSULE | Freq: Every day | ORAL | Status: DC
Start: 2015-03-04 — End: 2015-06-13

## 2015-03-04 MED ORDER — PREDNISONE 20 MG PO TABS: 20.0000 mg | ORAL_TABLET | Freq: Two times a day (BID) | ORAL | Status: DC

## 2015-03-04 MED ORDER — AZITHROMYCIN 250 MG PO TABS: ORAL_TABLET | ORAL | Status: DC

## 2015-03-04 NOTE — ED Notes (Signed)
Reports cough and congestion more than 2 weeks; including aches and hoaresnes, wheezing slightly, ear problems, sore throat and now diarrhea. No OTCs recently.

## 2015-03-04 NOTE — ED Provider Notes (Signed)
CSN: [id number]     Arrival date & time 03/04/15  1207 History   First MD Initiated Contact with Patient 03/04/15 1432     Chief Complaint  Patient presents with  . Cough  . Wheezing  . Nasal Congestion      HPI Comments: About three weeks ago patient developed typical cold-like symptoms including mild sore throat, sinus congestion, headache, fatigue, and cough.  Her cough has persisted, and last night she began wheezing and developed sweats.  She complains of bilateral earache and ears feel full. She has a family history of asthma in maternal relatives.                                                                                                                                                                                                                                                                                                                                                            The history is provided by the patient.    Past Medical History  Diagnosis Date  . Obesity    Past Surgical History  Procedure Laterality Date  . Tonsillectomy    . Cesarean section     Family History  Problem Relation Age of Onset  . Diabetes Mother   . Cancer Mother     breast  . Hyperlipidemia Father   . Hypertension Father    Social History  Substance Use Topics  . Smoking status: Former Smoker    Quit date: 06/23/1992  . Smokeless tobacco: Never Used  . Alcohol Use: Yes     Comment: rarely about once a year   OB History    No data available     Review of Systems + sore throat + hoarse + cough + sneezing No pleuritic pain No wheezing + nasal congestion +  post-nasal drainage No sinus pain/pressure No itchy/red eyes ? earache No hemoptysis + SOB No fever, + chills/sweats No nausea No vomiting No abdominal pain No diarrhea No urinary symptoms No skin rash + fatigue + myalgias No headache Used OTC meds without relief  Allergies  Naproxen and Naproxen  sodium  Home Medications   Prior to Admission medications   Medication Sig Start Date End Date Taking? Authorizing Provider  azithromycin (ZITHROMAX Z-PAK) 250 MG tablet Take 2 tabs today; then begin one tab once daily for 4 more days. 03/04/15   Lattie Haw, MD  benzonatate (TESSALON) 200 MG capsule Take 1 capsule (200 mg total) by mouth at bedtime. Take as needed for cough 03/04/15   Lattie Haw, MD  ibuprofen (ADVIL,MOTRIN) 800 MG tablet Take 1 tablet (800 mg total) by mouth every 8 (eight) hours as needed. 08/13/13   Monica Becton, MD  Insulin Pen Needle (NOVOFINE) 32G X 6 MM MISC Use as directed 05/25/14   Monica Becton, MD  Linaclotide [facility]) 145 MCG CAPS capsule Take 1 capsule (145 mcg total) by mouth daily. 05/12/14   Monica Becton, MD  predniSONE (DELTASONE) 20 MG tablet Take 1 tablet (20 mg total) by mouth 2 (two) times daily. Take with food. 03/04/15   Lattie Haw, MD   Meds Ordered and Administered this Visit  Medications - No data to display  BP 118/77 mmHg  Pulse 89  Temp(Src) 98.3 F (36.8 C) (Oral)  Resp 18  Ht 5\' 4"  (1.626 m)  Wt 204 lb (92.534 kg)  BMI 35.00 kg/m2  SpO2 100%  LMP 03/03/2015 No data found.   Physical Exam Nursing notes and Vital Signs reviewed. Appearance:  Patient appears stated age, and in no acute distress.  Patient is obese (BMI 35.0) Eyes:  Pupils are equal, round, and reactive to light and accomodation.  Extraocular movement is intact.  Conjunctivae are not inflamed  Ears:  Canals normal.  Tympanic membranes normal.  Nose:  Mildly congested turbinates.  No sinus tenderness.    Pharynx:  Normal Neck:  Supple.  Tender enlarged posterior nodes are palpated bilaterally  Lungs:  Clear to auscultation.  Breath sounds are equal.  Moving air well. Heart:  Regular rate and rhythm without murmurs, rubs, or gallops.  Abdomen:  Nontender without masses or hepatosplenomegaly.  Bowel sounds are present.  No CVA or flank  tenderness.  Extremities:  No edema.  Skin:  No rash present.   ED Course  Procedures  None   Imaging Review Dg Chest 2 View  03/04/2015  CLINICAL DATA:  Productive cough for 3 weeks. EXAM: CHEST  2 VIEW COMPARISON:  None. FINDINGS: The heart size and mediastinal contours are within normal limits. Both lungs are clear. No pneumothorax or pleural effusion is noted. The visualized skeletal structures are unremarkable. IMPRESSION: No active cardiopulmonary disease. Electronically Signed   By: Lupita Raider, M.D.   On: 03/04/2015 14:01      MDM   1. Acute bronchitis, unspecified organism   2. Bronchospasm    Begin Z-pak for atypical coverage.  Begin prednisone burst.  Prescription written for Benzonatate (Tessalon) to take at bedtime for night-time cough.  Take plain guaifenesin (1200mg  extended release tabs such as Mucinex) twice daily, with plenty of water, for cough and congestion.  May add Pseudoephedrine (30mg , one or two every 4 to 6 hours) for sinus congestion.  Get adequate rest.   May use Afrin nasal spray (or generic  oxymetazoline) twice daily for about 5 days and then discontinue.  Also recommend using saline nasal spray several times daily and saline nasal irrigation (AYR is a common brand).    Try warm salt water gargles for sore throat.  Stop all antihistamines for now, and other non-prescription cough/cold preparations.  Follow-up with family doctor if not improving about 6 days.    Lattie Haw, MD 03/08/15 [phone]

## 2015-03-04 NOTE — Discharge Instructions (Signed)
Take plain guaifenesin (  extended release tabs such as Mucinex) twice daily, with plenty of water, for cough and congestion.  May add Pseudoephedrine ( , one or two every 4 to 6 hours) for sinus congestion.  Get adequate rest.   May use Afrin nasal spray (or generic oxymetazoline) twice daily for about 5 days and then discontinue.  Also recommend using saline nasal spray several times daily and saline nasal irrigation (AYR is a common brand).    Try warm salt water gargles for sore throat.  Stop all antihistamines for now, and other non-prescription cough/cold preparations.  Follow-up with family doctor if not improving about 6 days.

## 2015-05-17 DIAGNOSIS — M25552 Pain in left hip: Secondary | ICD-10-CM | POA: Diagnosis not present

## 2015-05-17 DIAGNOSIS — S39013A Strain of muscle, fascia and tendon of pelvis, initial encounter: Secondary | ICD-10-CM | POA: Diagnosis not present

## 2015-05-17 DIAGNOSIS — M9905 Segmental and somatic dysfunction of pelvic region: Secondary | ICD-10-CM | POA: Diagnosis not present

## 2015-05-17 DIAGNOSIS — M9904 Segmental and somatic dysfunction of sacral region: Secondary | ICD-10-CM | POA: Diagnosis not present

## 2015-05-24 DIAGNOSIS — M9904 Segmental and somatic dysfunction of sacral region: Secondary | ICD-10-CM | POA: Diagnosis not present

## 2015-05-24 DIAGNOSIS — M9905 Segmental and somatic dysfunction of pelvic region: Secondary | ICD-10-CM | POA: Diagnosis not present

## 2015-05-24 DIAGNOSIS — M25552 Pain in left hip: Secondary | ICD-10-CM | POA: Diagnosis not present

## 2015-05-24 DIAGNOSIS — S39013A Strain of muscle, fascia and tendon of pelvis, initial encounter: Secondary | ICD-10-CM | POA: Diagnosis not present

## 2015-05-31 DIAGNOSIS — M9904 Segmental and somatic dysfunction of sacral region: Secondary | ICD-10-CM | POA: Diagnosis not present

## 2015-05-31 DIAGNOSIS — S39013A Strain of muscle, fascia and tendon of pelvis, initial encounter: Secondary | ICD-10-CM | POA: Diagnosis not present

## 2015-05-31 DIAGNOSIS — M9905 Segmental and somatic dysfunction of pelvic region: Secondary | ICD-10-CM | POA: Diagnosis not present

## 2015-05-31 DIAGNOSIS — M25552 Pain in left hip: Secondary | ICD-10-CM | POA: Diagnosis not present

## 2015-06-13 ENCOUNTER — Ambulatory Visit (INDEPENDENT_AMBULATORY_CARE_PROVIDER_SITE_OTHER): Payer: BLUE CROSS/BLUE SHIELD | Admitting: Sports Medicine

## 2015-06-13 ENCOUNTER — Ambulatory Visit (INDEPENDENT_AMBULATORY_CARE_PROVIDER_SITE_OTHER): Payer: BLUE CROSS/BLUE SHIELD

## 2015-06-13 ENCOUNTER — Encounter: Payer: Self-pay | Admitting: Sports Medicine

## 2015-06-13 VITALS — BP 120/86 | HR 99 | Resp 16 | Wt 214.1 lb

## 2015-06-13 DIAGNOSIS — R1030 Lower abdominal pain, unspecified: Secondary | ICD-10-CM | POA: Diagnosis not present

## 2015-06-13 DIAGNOSIS — E669 Obesity, unspecified: Secondary | ICD-10-CM

## 2015-06-13 DIAGNOSIS — M7989 Other specified soft tissue disorders: Secondary | ICD-10-CM | POA: Diagnosis not present

## 2015-06-13 DIAGNOSIS — M24159 Other articular cartilage disorders, unspecified hip: Secondary | ICD-10-CM

## 2015-06-13 DIAGNOSIS — M25552 Pain in left hip: Secondary | ICD-10-CM

## 2015-06-13 DIAGNOSIS — M169 Osteoarthritis of hip, unspecified: Secondary | ICD-10-CM | POA: Diagnosis not present

## 2015-06-13 NOTE — Assessment & Plan Note (Signed)
Checking a d-dimer

## 2015-06-13 NOTE — Progress Notes (Signed)
  Subjective:    CC: Left hip pain  HPI: This is a pleasant 40 year old female, for the past several weeks she's had worsening pain in her left groin, with catching, clicking, buckling. Pain radiates to the anterior groin, and down to the mid femur as well as around to the lateral aspect of her hip. She is also noted increasing swelling in her left lower leg. Symptoms are moderate, persistent.  Past medical history, Surgical history, Family history not pertinant except as noted below, Social history, Allergies, and medications have been entered into the medical record, reviewed, and no changes needed.   Review of Systems: No fevers, chills, night sweats, weight loss, chest pain, or shortness of breath.   Objective:    General: Well Developed, well nourished, and in no acute distress.  Neuro: Alert and oriented x3, extra-ocular muscles intact, sensation grossly intact.  HEENT: Normocephalic, atraumatic, pupils equal round reactive to light, neck supple, no masses, no lymphadenopathy, thyroid nonpalpable.  Skin: Warm and dry, no rashes. Cardiac: Regular rate and rhythm, no murmurs rubs or gallops, no lower extremity edema.  Respiratory: Clear to auscultation bilaterally. Not using accessory muscles, speaking in full sentences. Left Hip: ROM IR: 60 Deg, ER: 60 Deg, Flexion: 120 Deg, Extension: 100 Deg, Abduction: 45 Deg, Adduction: 45 Deg Strength IR: 5/5, ER: 5/5, Flexion: 5/5, Extension: 5/5, Abduction: 5/5, Adduction: 5/5 Pelvic alignment unremarkable to inspection and palpation. Standing hip rotation and gait without trendelenburg / unsteadiness. Greater trochanter without tenderness to palpation. No tenderness over piriformis. No SI joint tenderness and normal minimal SI movement.  Positive flexion, adduction, internal rotation test with reproduction of pain. Left lower extremity shows minimal swelling with a negative Homans sign.  Impression and Recommendations:

## 2015-06-13 NOTE — Assessment & Plan Note (Signed)
Checking routine blood work in anticipation of her upcoming physical.

## 2015-06-13 NOTE — Assessment & Plan Note (Signed)
X-rays, we are going to set her up for an MR arthrogram, I will see her for the arthrogram injection.

## 2015-06-14 DIAGNOSIS — S39013A Strain of muscle, fascia and tendon of pelvis, initial encounter: Secondary | ICD-10-CM | POA: Diagnosis not present

## 2015-06-14 DIAGNOSIS — M25552 Pain in left hip: Secondary | ICD-10-CM | POA: Diagnosis not present

## 2015-06-14 DIAGNOSIS — M7612 Psoas tendinitis, left hip: Secondary | ICD-10-CM | POA: Diagnosis not present

## 2015-06-14 DIAGNOSIS — M9906 Segmental and somatic dysfunction of lower extremity: Secondary | ICD-10-CM | POA: Diagnosis not present

## 2015-06-14 DIAGNOSIS — M9904 Segmental and somatic dysfunction of sacral region: Secondary | ICD-10-CM | POA: Diagnosis not present

## 2015-06-14 DIAGNOSIS — M9903 Segmental and somatic dysfunction of lumbar region: Secondary | ICD-10-CM | POA: Diagnosis not present

## 2015-06-14 DIAGNOSIS — M9902 Segmental and somatic dysfunction of thoracic region: Secondary | ICD-10-CM | POA: Diagnosis not present

## 2015-06-14 LAB — COMPREHENSIVE METABOLIC PANEL WITH GFR
Albumin: 4.7 g/dL (ref 3.6–5.1)
Creat: 0.84 mg/dL (ref 0.50–1.10)
Sodium: 138 mmol/L (ref 135–146)
Total Protein: 7.4 g/dL (ref 6.1–8.1)

## 2015-06-14 LAB — D-DIMER, QUANTITATIVE: D-Dimer, Quant: 0.22 ug/mL-FEU (ref 0.00–0.48)

## 2015-06-14 LAB — CBC
HCT: 39.8 % (ref 35.0–45.0)
Hemoglobin: 13.5 g/dL (ref 11.7–15.5)
MCH: 28 pg (ref 27.0–33.0)
MCHC: 33.9 g/dL (ref 32.0–36.0)
MCV: 82.4 fL (ref 80.0–100.0)
MPV: 9.1 fL (ref 7.5–12.5)
Platelets: 365 10*3/uL (ref 140–400)
RBC: 4.83 MIL/uL (ref 3.80–5.10)
RDW: 14.4 % (ref 11.0–15.0)
WBC: 7.4 10*3/uL (ref 3.8–10.8)

## 2015-06-14 LAB — LIPID PANEL
Cholesterol: 202 mg/dL — ABNORMAL HIGH (ref 125–200)
HDL: 56 mg/dL (ref >=46)
LDL Cholesterol: 122 mg/dL (ref <130)
Total CHOL/HDL Ratio: 3.6 ratio (ref <=5.0)
Triglycerides: 119 mg/dL (ref <150)
VLDL: 24 mg/dL (ref <30)

## 2015-06-14 LAB — COMPREHENSIVE METABOLIC PANEL
ALT: 20 U/L (ref 6–29)
AST: 13 U/L (ref 10–30)
Alkaline Phosphatase: 63 U/L (ref 33–115)
BUN: 13 mg/dL (ref 7–25)
CO2: 24 mmol/L (ref 20–31)
Calcium: 9.1 mg/dL (ref 8.6–10.2)
Chloride: 105 mmol/L (ref 98–110)
Glucose, Bld: 88 mg/dL (ref 65–99)
Potassium: 4.2 mmol/L (ref 3.5–5.3)
Total Bilirubin: 0.5 mg/dL (ref 0.2–1.2)

## 2015-06-14 LAB — TSH: TSH: 1.61 mIU/L

## 2015-06-14 LAB — HEMOGLOBIN A1C
Hgb A1c MFr Bld: 5.5 % (ref <5.7)
Mean Plasma Glucose: 111 mg/dL

## 2015-06-19 ENCOUNTER — Ambulatory Visit (INDEPENDENT_AMBULATORY_CARE_PROVIDER_SITE_OTHER): Payer: BLUE CROSS/BLUE SHIELD | Admitting: Sports Medicine

## 2015-06-19 ENCOUNTER — Ambulatory Visit (INDEPENDENT_AMBULATORY_CARE_PROVIDER_SITE_OTHER): Payer: BLUE CROSS/BLUE SHIELD

## 2015-06-19 VITALS — BP 117/85 | HR 101 | Resp 18 | Wt 214.0 lb

## 2015-06-19 DIAGNOSIS — M169 Osteoarthritis of hip, unspecified: Principal | ICD-10-CM

## 2015-06-19 DIAGNOSIS — M25552 Pain in left hip: Secondary | ICD-10-CM

## 2015-06-19 DIAGNOSIS — M24159 Other articular cartilage disorders, unspecified hip: Secondary | ICD-10-CM

## 2015-06-19 DIAGNOSIS — M1612 Unilateral primary osteoarthritis, left hip: Secondary | ICD-10-CM | POA: Diagnosis not present

## 2015-06-19 NOTE — Progress Notes (Signed)
  Procedure: Real-time Ultrasound Guided gadolinium contrast injection of left hip joint Device: GE Logiq E  Verbal informed consent obtained.  Time-out conducted.  Noted no overlying erythema, induration, or other signs of local infection.  Skin prepped in a sterile fashion.  Local anesthesia: Topical Ethyl chloride.  With sterile technique and under real time ultrasound guidance:  Spinal needle advanced to the femoral head/neck junction, 1 mL kenalog 40, 2 mL lidocaine 2 mL Marcaine injected easily, syringe switched and 0.1 mL gadolinium injected, syringe again switched and 10 mL sterile saline injected. Joint visualized and capsule seen distending confirming intra-articular placement of contrast material and medication. Completed without difficulty  Advised to call if fevers/chills, erythema, induration, drainage, or persistent bleeding.  Images permanently stored and available for review in the ultrasound unit.  Impression: Technically successful ultrasound guided gadolinium contrast injection for MR arthrography.  Please see separate MR arthrogram report.

## 2015-06-19 NOTE — Assessment & Plan Note (Signed)
Arthrogram injection for MR arthrogram. Suspicion for labral tear. Return to go over MRI results.

## 2015-06-28 DIAGNOSIS — S39013A Strain of muscle, fascia and tendon of pelvis, initial encounter: Secondary | ICD-10-CM | POA: Diagnosis not present

## 2015-06-28 DIAGNOSIS — M9904 Segmental and somatic dysfunction of sacral region: Secondary | ICD-10-CM | POA: Diagnosis not present

## 2015-06-28 DIAGNOSIS — M25552 Pain in left hip: Secondary | ICD-10-CM | POA: Diagnosis not present

## 2015-06-28 DIAGNOSIS — M9905 Segmental and somatic dysfunction of pelvic region: Secondary | ICD-10-CM | POA: Diagnosis not present

## 2015-07-05 DIAGNOSIS — M9904 Segmental and somatic dysfunction of sacral region: Secondary | ICD-10-CM | POA: Diagnosis not present

## 2015-07-05 DIAGNOSIS — M9905 Segmental and somatic dysfunction of pelvic region: Secondary | ICD-10-CM | POA: Diagnosis not present

## 2015-07-05 DIAGNOSIS — S39013A Strain of muscle, fascia and tendon of pelvis, initial encounter: Secondary | ICD-10-CM | POA: Diagnosis not present

## 2015-07-05 DIAGNOSIS — M25552 Pain in left hip: Secondary | ICD-10-CM | POA: Diagnosis not present

## 2016-02-09 ENCOUNTER — Encounter: Payer: Self-pay | Admitting: Sports Medicine

## 2016-02-09 ENCOUNTER — Ambulatory Visit (INDEPENDENT_AMBULATORY_CARE_PROVIDER_SITE_OTHER): Payer: BLUE CROSS/BLUE SHIELD | Admitting: Sports Medicine

## 2016-02-09 DIAGNOSIS — J0100 Acute maxillary sinusitis, unspecified: Secondary | ICD-10-CM

## 2016-02-09 DIAGNOSIS — E669 Obesity, unspecified: Secondary | ICD-10-CM | POA: Diagnosis not present

## 2016-02-09 DIAGNOSIS — J320 Chronic maxillary sinusitis: Secondary | ICD-10-CM | POA: Insufficient documentation

## 2016-02-09 MED ORDER — FLUCONAZOLE 150 MG PO TABS: 150.0000 mg | ORAL_TABLET | Freq: Once | ORAL | 0 refills | Status: AC

## 2016-02-09 MED ORDER — AMOXICILLIN-POT CLAVULANATE 875-125 MG PO TABS: 1.0000 | ORAL_TABLET | Freq: Two times a day (BID) | ORAL | 0 refills | Status: AC

## 2016-02-09 MED ORDER — PHENTERMINE-TOPIRAMATE ER 3.75-23 MG PO CP24: 1.0000 | ORAL_CAPSULE | Freq: Every morning | ORAL | 0 refills | Status: DC

## 2016-02-09 NOTE — Progress Notes (Signed)
  Subjective:    CC: Feeling sick  HPI: For the past week this pleasant 41 year old female has had facial pain and pressure, purulent nasal discharge, no constitutional symptoms, she's had sinus infections before and this feels similar.   Obesity: Has failed 3 much every weight loss medication, agreeable to try Qsymia. She is also agreeable to touch base with bariatric surgery to see her surgical options.  Past medical history:  Negative.  See flowsheet/record as well for more information.  Surgical history: Negative.  See flowsheet/record as well for more information.  Family history: Negative.  See flowsheet/record as well for more information.  Social history: Negative.  See flowsheet/record as well for more information.  Allergies, and medications have been entered into the medical record, reviewed, and no changes needed.   Review of Systems: No fevers, chills, night sweats, weight loss, chest pain, or shortness of breath.   Objective:    General: Well Developed, well nourished, and in no acute distress.  Neuro: Alert and oriented x3, extra-ocular muscles intact, sensation grossly intact.  HEENT: Normocephalic, atraumatic, pupils equal round reactive to light, neck supple, no masses, no lymphadenopathy, thyroid nonpalpable. Oropharynx, nasopharynx, ear canals unremarkable but she is tender over the maxillary sinuses.  Skin: Warm and dry, no rashes. Cardiac: Regular rate and rhythm, no murmurs rubs or gallops, no lower extremity edema.  Respiratory: Clear to auscultation bilaterally. Not using accessory muscles, speaking in full sentences.  Impression and Recommendations:    Maxillary sinusitis Augmentin, Flonase, Diflucan  Obesity Starting low-dose Qsymia, return one month for a weight check and refills. Also placing a referral for bariatrics evaluation.  I spent 25 minutes with this patient, greater than 50% was face-to-face time counseling regarding the above diagnoses

## 2016-02-09 NOTE — Assessment & Plan Note (Signed)
Augmentin, Flonase, Diflucan. 

## 2016-02-09 NOTE — Assessment & Plan Note (Signed)
Starting low-dose Qsymia, return one month for a weight check and refills. Also placing a referral for bariatrics evaluation.

## 2016-03-08 ENCOUNTER — Ambulatory Visit: Payer: BLUE CROSS/BLUE SHIELD | Admitting: Sports Medicine

## 2016-04-24 DIAGNOSIS — Z01419 Encounter for gynecological examination (general) (routine) without abnormal findings: Secondary | ICD-10-CM | POA: Diagnosis not present

## 2016-04-24 DIAGNOSIS — R87610 Atypical squamous cells of undetermined significance on cytologic smear of cervix (ASC-US): Secondary | ICD-10-CM | POA: Diagnosis not present

## 2016-04-24 DIAGNOSIS — Z1231 Encounter for screening mammogram for malignant neoplasm of breast: Secondary | ICD-10-CM | POA: Diagnosis not present

## 2016-06-10 ENCOUNTER — Ambulatory Visit: Payer: BLUE CROSS/BLUE SHIELD | Admitting: Sports Medicine

## 2016-12-02 DIAGNOSIS — R87610 Atypical squamous cells of undetermined significance on cytologic smear of cervix (ASC-US): Secondary | ICD-10-CM | POA: Diagnosis not present

## 2016-12-03 LAB — HM PAP SMEAR: HM Pap smear: NEGATIVE

## 2017-01-14 ENCOUNTER — Encounter: Payer: Self-pay | Admitting: Sports Medicine

## 2017-01-14 ENCOUNTER — Ambulatory Visit: Payer: BLUE CROSS/BLUE SHIELD | Admitting: Sports Medicine

## 2017-01-14 DIAGNOSIS — Z0000 Encounter for general adult medical examination without abnormal findings: Secondary | ICD-10-CM

## 2017-01-14 DIAGNOSIS — G5603 Carpal tunnel syndrome, bilateral upper limbs: Secondary | ICD-10-CM | POA: Diagnosis not present

## 2017-01-14 NOTE — Assessment & Plan Note (Signed)
Left side is doing okay, recurrence of symptoms in the right. Repeat median nerve hydrodissection, 2.5-year response to the previous procedure. Return as needed. I did encourage her to wear the splints at night.

## 2017-01-14 NOTE — Assessment & Plan Note (Signed)
Up-to-date on cervical cancer screening, it is time to order routine blood work.

## 2017-01-14 NOTE — Progress Notes (Signed)
Subjective:    CC: Right hand pain  HPI: This is a pleasant 41 year old female, she has had right hand pain now for several weeks, she had a median nerve hydrodissection 2.5 years ago.  Symptoms have restarted, worsening, moderate.  Paresthesias into the first through fourth fingers worse in the morning.  She has been wearing her night splint, wearing the night splint only provides her with maybe an hour or 2 of relief when she removes in the morning.  She has not had routine labs done in over a year  Past medical history:  Negative.  See flowsheet/record as well for more information.  Surgical history: Negative.  See flowsheet/record as well for more information.  Family history: Negative.  See flowsheet/record as well for more information.  Social history: Negative.  See flowsheet/record as well for more information.  Allergies, and medications have been entered into the medical record, reviewed, and no changes needed.   (To billers/coders, pertinent past medical, social, surgical, family history can be found in problem list, if problem list is marked as reviewed then this indicates that past medical, social, surgical, family history was also reviewed)  Review of Systems: No fevers, chills, night sweats, weight loss, chest pain, or shortness of breath.   Objective:    General: Well Developed, well nourished, and in no acute distress.  Neuro: Alert and oriented x3, extra-ocular muscles intact, sensation grossly intact.  HEENT: Normocephalic, atraumatic, pupils equal round reactive to light, neck supple, no masses, no lymphadenopathy, thyroid nonpalpable.  Skin: Warm and dry, no rashes. Cardiac: Regular rate and rhythm, no murmurs rubs or gallops, no lower extremity edema.  Respiratory: Clear to auscultation bilaterally. Not using accessory muscles, speaking in full sentences. Right wrist: Inspection normal with no visible erythema or swelling. ROM smooth and normal with good flexion  and extension and ulnar/radial deviation that is symmetrical with opposite wrist. Palpation is normal over metacarpals, navicular, lunate, and TFCC; tendons without tenderness/ swelling No snuffbox tenderness. No tenderness over Canal of Guyon. Strength 5/5 in all directions without pain. Positive Tinel's and phalens signs. Negative Finkelstein sign. Negative Watson's test.  Procedure: Real-time Ultrasound Guided Hydro dissection of median nerve in the right carpal tunnel Device: GE Logiq E  Verbal informed consent obtained.  Time-out conducted.  Noted no overlying erythema, induration, or other signs of local infection.  Skin prepped in a sterile fashion.  Local anesthesia: Topical Ethyl chloride.  With sterile technique and under real time ultrasound guidance:   Using a 25-gauge needle and taking care to avoid intraneural injection I injected medication both superficial to and deep to the median nerve freeing it from surrounding structures, I then redirected the needle deep into the carpal tunnel and injected the rest of the medication for a total of 1 cc kenalog 40, 5 cc lidocaine. Completed without difficulty  Pain immediately resolved suggesting accurate placement of the medication.  Advised to call if fevers/chills, erythema, induration, drainage, or persistent bleeding.  Images permanently stored and available for review in the ultrasound unit.  Impression: Technically successful ultrasound guided injection.  Impression and Recommendations:    Carpal tunnel syndrome, bilateral Left side is doing okay, recurrence of symptoms in the right. Repeat median nerve hydrodissection, 2.5-year response to the previous procedure. Return as needed. I did encourage her to wear the splints at night.  Annual physical exam Up-to-date on cervical cancer screening, it is time to order routine blood work.  ___________________________________________ Ihor Austinhomas J. Benjamin Stainhekkekandam, M.D., ABFM.,  CAQSM. Primary  Care and [place] Instructor of [place] of [facility] of Medicine

## 2017-01-27 DIAGNOSIS — Z0000 Encounter for general adult medical examination without abnormal findings: Secondary | ICD-10-CM | POA: Diagnosis not present

## 2017-01-28 LAB — COMPREHENSIVE METABOLIC PANEL WITH GFR
AST: 11 U/L (ref 10–30)
Chloride: 103 mmol/L (ref 98–110)
Globulin: 2.5 g/dL (ref 1.9–3.7)
Glucose, Bld: 104 mg/dL — ABNORMAL HIGH (ref 65–99)

## 2017-01-28 LAB — COMPREHENSIVE METABOLIC PANEL
AG Ratio: 1.6 (calc) (ref 1.0–2.5)
ALT: 21 U/L (ref 6–29)
Albumin: 4.1 g/dL (ref 3.6–5.1)
Alkaline phosphatase (APISO): 56 U/L (ref 33–115)
BUN: 19 mg/dL (ref 7–25)
CO2: 29 mmol/L (ref 20–32)
Calcium: 9.1 mg/dL (ref 8.6–10.2)
Creat: 0.85 mg/dL (ref 0.50–1.10)
Potassium: 4.4 mmol/L (ref 3.5–5.3)
Sodium: 139 mmol/L (ref 135–146)
Total Bilirubin: 0.6 mg/dL (ref 0.2–1.2)
Total Protein: 6.6 g/dL (ref 6.1–8.1)

## 2017-01-28 LAB — LIPID PANEL W/REFLEX DIRECT LDL
Cholesterol: 217 mg/dL — ABNORMAL HIGH (ref <200)
HDL: 68 mg/dL (ref >50)
LDL Cholesterol (Calc): 132 mg/dL (calc) — ABNORMAL HIGH
Non-HDL Cholesterol (Calc): 149 mg/dL — ABNORMAL HIGH (ref <130)
Total CHOL/HDL Ratio: 3.2 (calc) (ref <5.0)
Triglycerides: 80 mg/dL (ref <150)

## 2017-01-28 LAB — HEMOGLOBIN A1C
Hgb A1c MFr Bld: 5.4 %{Hb} (ref <5.7)
Mean Plasma Glucose: 108 (calc)
eAG (mmol/L): 6 (calc)

## 2017-01-28 LAB — CBC
HCT: 40.8 % (ref 35.0–45.0)
Hemoglobin: 13.8 g/dL (ref 11.7–15.5)
MCH: 30.1 pg (ref 27.0–33.0)
MCHC: 33.8 g/dL (ref 32.0–36.0)
MCV: 89.1 fL (ref 80.0–100.0)
MPV: 9.2 fL (ref 7.5–12.5)
Platelets: 320 10*3/uL (ref 140–400)
RBC: 4.58 Million/uL (ref 3.80–5.10)
RDW: 12.2 % (ref 11.0–15.0)
WBC: 7.2 Thousand/uL (ref 3.8–10.8)

## 2017-01-28 LAB — VITAMIN D 25 HYDROXY (VIT D DEFICIENCY, FRACTURES): Vit D, 25-Hydroxy: 41 ng/mL (ref 30–100)

## 2017-01-28 LAB — TSH: TSH: 2.63 mIU/L

## 2017-01-28 LAB — HIV ANTIBODY (ROUTINE TESTING W REFLEX): HIV 1&2 Ab, 4th Generation: NONREACTIVE

## 2017-01-29 ENCOUNTER — Encounter: Payer: Self-pay | Admitting: Physician Assistant

## 2017-01-29 DIAGNOSIS — E785 Hyperlipidemia, unspecified: Secondary | ICD-10-CM | POA: Insufficient documentation

## 2017-02-17 ENCOUNTER — Telehealth: Payer: Self-pay

## 2017-02-17 NOTE — Telephone Encounter (Signed)
Same as for Melanee Spryan, have her pick one and I will send the referral.

## 2017-02-17 NOTE — Telephone Encounter (Signed)
Patient's spouse called in requesting dermatology referral due to theirs retiring.

## 2017-02-19 NOTE — Telephone Encounter (Signed)
Called patient - advised provider notations. Patient stated she would talk to her husband; call their insurance company; then call out office back with preference.

## 2017-02-24 ENCOUNTER — Ambulatory Visit (INDEPENDENT_AMBULATORY_CARE_PROVIDER_SITE_OTHER): Payer: BLUE CROSS/BLUE SHIELD | Admitting: Sports Medicine

## 2017-02-24 ENCOUNTER — Encounter: Payer: Self-pay | Admitting: Sports Medicine

## 2017-02-24 DIAGNOSIS — L919 Hypertrophic disorder of the skin, unspecified: Secondary | ICD-10-CM

## 2017-02-24 DIAGNOSIS — K644 Residual hemorrhoidal skin tags: Secondary | ICD-10-CM | POA: Insufficient documentation

## 2017-02-24 DIAGNOSIS — Z0000 Encounter for general adult medical examination without abnormal findings: Secondary | ICD-10-CM

## 2017-02-24 DIAGNOSIS — G5603 Carpal tunnel syndrome, bilateral upper limbs: Secondary | ICD-10-CM | POA: Diagnosis not present

## 2017-02-24 NOTE — Progress Notes (Signed)
Subjective:    CC: Annual physical  HPI:  Breanna [name] is here for a physical, she has no complaints except a skin lesion on her right gluteal region.  She is up-to-date on her cervical cancer screening, labs looked okay with the exception of a minimally elevated LDL.  Carpal tunnel syndrome: Right-sided, we have done nighttime splinting, more recently an ultrasound-guided median nerve hydrodissection, she had partial relief but still has some symptoms.  Agreeable to discuss this with hand surgery.  I reviewed the past medical history, family history, social history, surgical history, and allergies today and no changes were needed.  Please see the problem list section below in epic for further details.  Past Medical History: Past Medical History:  Diagnosis Date  . Obesity    Past Surgical History: Past Surgical History:  Procedure Laterality Date  . CESAREAN SECTION    . TONSILLECTOMY     Social History: Social History   Socioeconomic History  . Marital status: Married    Spouse name: None  . Number of children: None  . Years of education: None  . Highest education level: None  Social Needs  . Financial resource strain: None  . Food insecurity - worry: None  . Food insecurity - inability: None  . Transportation needs - medical: None  . Transportation needs - non-medical: None  Occupational History  . None  Tobacco Use  . Smoking status: Former Smoker    Last attempt to quit: 06/23/1992    Years since quitting: 24.6  . Smokeless tobacco: Never Used  Substance and Sexual Activity  . Alcohol use: Yes    Comment: rarely about once a year  . Drug use: No  . Sexual activity: None  Other Topics Concern  . None  Social History Narrative  . None   Family History: Family History  Problem Relation Age of Onset  . Diabetes Mother   . Cancer Mother        breast  . Hyperlipidemia Father   . Hypertension Father    Allergies: Allergies  Allergen Reactions  . Naproxen     . Naproxen Sodium    Medications: See med rec.  Review of Systems: No headache, visual changes, nausea, vomiting, diarrhea, constipation, dizziness, abdominal pain, skin rash, fevers, chills, night sweats, swollen lymph nodes, weight loss, chest pain, body aches, joint swelling, muscle aches, shortness of breath, mood changes, visual or auditory hallucinations.  Objective:    General: Well Developed, well nourished, and in no acute distress.  Neuro: Alert and oriented x3, extra-ocular muscles intact, sensation grossly intact. Cranial nerves II through XII are intact, motor, sensory, and coordinative functions are all intact. HEENT: Normocephalic, atraumatic, pupils equal round reactive to light, neck supple, no masses, no lymphadenopathy, thyroid nonpalpable. Oropharynx, nasopharynx, external ear canals are unremarkable. Skin: Warm and dry, no rashes noted. There is a small skin tag over the right medial gluteus Cardiac: Regular rate and rhythm, no murmurs rubs or gallops.  Respiratory: Clear to auscultation bilaterally. Not using accessory muscles, speaking in full sentences.  Abdominal: Soft, nontender, nondistended, positive bowel sounds, no masses, no organomegaly.  Musculoskeletal: Shoulder, elbow, wrist, hip, knee, ankle stable, and with full range of motion.  Procedure:  Cryodestruction of skin tag Consent obtained and verified. Time-out conducted. Noted no overlying erythema, induration, or other signs of local infection. Completed without difficulty using Cryo-Gun. Advised to call if fevers/chills, erythema, induration, drainage, or persistent bleeding.  Impression and Recommendations:    The patient was  counselled, risk factors were discussed, anticipatory guidance given.  Annual physical exam Annual physical as above. Up-to-date on screening measures.  Carpal tunnel syndrome, bilateral Persistent symptoms on the right in spite of median nerve hydrodissection, nighttime  splinting. Referral to hand surgery.  Skin tag of anus Cryotherapy as above.  ___________________________________________ Ihor Austinhomas J. Benjamin Stainhekkekandam, M.D., ABFM., CAQSM. Primary Care and Sports Medicine [place] [facility]  Adjunct Instructor of Family Medicine  [employer]

## 2017-02-24 NOTE — Assessment & Plan Note (Signed)
Annual physical as above. Up-to-date on screening measures. 

## 2017-02-24 NOTE — Assessment & Plan Note (Signed)
Cryotherapy as above. 

## 2017-02-24 NOTE — Assessment & Plan Note (Signed)
Persistent symptoms on the right in spite of median nerve hydrodissection, nighttime splinting. Referral to hand surgery.

## 2017-02-28 ENCOUNTER — Encounter: Payer: Self-pay | Admitting: Sports Medicine

## 2017-03-12 ENCOUNTER — Emergency Department
Admission: EM | Admit: 2017-03-12 | Discharge: 2017-03-12 | Disposition: A | Discharge status: Home / Self Care | Payer: BLUE CROSS/BLUE SHIELD | Source: Home / Self Care | Attending: Family Medicine | Admitting: Family Medicine

## 2017-03-12 ENCOUNTER — Other Ambulatory Visit: Payer: Self-pay

## 2017-03-12 DIAGNOSIS — J0100 Acute maxillary sinusitis, unspecified: Secondary | ICD-10-CM

## 2017-03-12 MED ORDER — AMOXICILLIN-POT CLAVULANATE 875-125 MG PO TABS: 1.0000 | ORAL_TABLET | Freq: Two times a day (BID) | ORAL | 0 refills | Status: DC

## 2017-03-12 NOTE — Discharge Instructions (Signed)
°  You may take 500mg  acetaminophen every 4-6 hours as needed for pain, inflammation, and fever.  Be sure to drink at least eight 8oz glasses of water to stay well hydrated and get at least 8 hours of sleep at night, preferably more while sick.   Please take antibiotics as prescribed and be sure to complete entire course even if you start to feel better to ensure infection does not come back.

## 2017-03-12 NOTE — ED Provider Notes (Signed)
[facility]    CSN: [id number] Arrival date & time: 03/12/17  1700     History   Chief Complaint Chief Complaint  Patient presents with  . Facial Pain    HPI Breanna [name] is a 42 y.o. female.   HPI  Breanna [name] is a 42 y.o. female presenting to UC with c/o 1 week of worsening sinus congestion and pressure that developed into maxillary sinus and jaw pain yesterday.  Hx of sinus infections, symptoms feel similar. Last sinus infection was about 1 year ago.  She has tried nasal decongestants w/o relief.  Her husband has been sick recently with the flu and bronchitis. Pt denies body aches, fever, chills, chest pain or SOB. Mild cough but pt believes it is due to post-nasal drip.    Past Medical History:  Diagnosis Date  . Obesity     Patient Active Problem List   Diagnosis Date Noted  . Skin tag of anus 02/24/2017  . Hyperlipidemia 01/29/2017  . Maxillary sinusitis 02/09/2016  . 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 Left leg swelling 06/13/2015  . Goiter 08/09/2014  . Carpal tunnel syndrome, bilateral 04/15/2014  . 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 Chronic idiopathic constipation 09/10/2012  . Cervical radiculitis 06/25/2012  . Cyst of breast, left, benign solitary 05/26/2012  . Facial rash 05/26/2012  . Obesity 05/26/2012  . Annual physical exam 05/26/2012  . GENITAL HERPES 11/24/2009    Past Surgical History:  Procedure Laterality Date  . CESAREAN SECTION    . TONSILLECTOMY      OB History    No data available       Home Medications    Prior to Admission medications   Medication Sig Start Date End Date Taking? Authorizing Provider  amoxicillin-clavulanate (AUGMENTIN) 875-125 MG tablet Take 1 tablet by mouth 2 (two) times daily. One po bid x 7 days 03/12/17   Lurene Shadow, PA-C    Family History Family History  Problem Relation Age of Onset  . Diabetes Mother   . Cancer Mother        breast  . Hyperlipidemia  Father   . Hypertension Father     Social History Social History   Tobacco Use  . Smoking status: Former Smoker    Last attempt to quit: 06/23/1992    Years since quitting: 24.7  . Smokeless tobacco: Never Used  Substance Use Topics  . Alcohol use: Yes    Comment: rarely about once a year  . Drug use: No     Allergies   Naproxen and Naproxen sodium   Review of Systems Review of Systems  Constitutional: Negative for chills and fever.  HENT: Positive for congestion, ear pain (bilateral pressure), postnasal drip, sinus pressure and sinus pain. Negative for rhinorrhea, sore throat, trouble swallowing and voice change.   Respiratory: Positive for cough ( minimal). Negative for shortness of breath.   Cardiovascular: Negative for chest pain and palpitations.  Gastrointestinal: Negative for abdominal pain, diarrhea, nausea and vomiting.  Musculoskeletal: Negative for arthralgias, back pain and myalgias.  Skin: Negative for rash.  Neurological: Positive for headaches (frontal). Negative for dizziness and light-headedness.     Physical Exam Triage Vital Signs ED Triage Vitals  Enc Vitals Group     BP 03/12/17 1722 126/82     Pulse Rate 03/12/17 1722 90     Resp --      Temp 03/12/17 1722 97.9 F (36.6 C)     Temp  Source 03/12/17 1722 Oral     SpO2 03/12/17 1722 96 %     Weight 03/12/17 1723 214 lb (97.1 kg)     Height 03/12/17 1723 5\' 4"  (1.626 m)     Head Circumference --      Peak Flow --      Pain Score 03/12/17 1723 5     Pain Loc --      Pain Edu? --      Excl. in GC? --    No data found.  Updated Vital Signs BP 126/82 (BP Location: Left Arm)   Pulse 90   Temp 97.9 F (36.6 C) (Oral)   Ht 5\' 4"  (1.626 m)   Wt 214 lb (97.1 kg)   LMP 03/06/2017   SpO2 96%   BMI 36.73 kg/m   Visual Acuity Right Eye Distance:   Left Eye Distance:   Bilateral Distance:    Right Eye Near:   Left Eye Near:    Bilateral Near:     Physical Exam  Constitutional: She is  oriented to person, place, and time. She appears well-developed and well-nourished. No distress.  HENT:  Head: Normocephalic and atraumatic.  Right Ear: Tympanic membrane normal.  Left Ear: Tympanic membrane normal.  Nose: Right sinus exhibits maxillary sinus tenderness. Right sinus exhibits no frontal sinus tenderness. Left sinus exhibits maxillary sinus tenderness. Left sinus exhibits no frontal sinus tenderness.  Mouth/Throat: Uvula is midline, oropharynx is clear and moist and mucous membranes are normal.  Eyes: EOM are normal.  Neck: Normal range of motion. Neck supple.  Cardiovascular: Normal rate and regular rhythm.  Pulmonary/Chest: Effort normal and breath sounds normal. No stridor. No respiratory distress. She has no wheezes. She has no rales.  Musculoskeletal: Normal range of motion.  Lymphadenopathy:    She has no cervical adenopathy.  Neurological: She is alert and oriented to person, place, and time.  Skin: Skin is warm and dry. She is not diaphoretic.  Psychiatric: She has a normal mood and affect. Her behavior is normal.  Nursing note and vitals reviewed.    UC Treatments / Results  Labs (all labs ordered are listed, but only abnormal results are displayed) Labs Reviewed - No data to display  EKG  EKG Interpretation None       Radiology No results found.  Procedures Procedures (including critical care time)  Medications Ordered in UC Medications - No data to display   Initial Impression / Assessment and Plan / UC Course  I have reviewed the triage vital signs and the nursing notes.  Pertinent labs & imaging results that were available during my care of the patient were reviewed by me and considered in my medical decision making (see chart for details).     Hx and exam c/w sinusitis Will tx with augmentin Home care instructions provided F/u with PCP in 1 week if not improving.   Final Clinical Impressions(s) / UC Diagnoses   Final diagnoses:    Acute non-recurrent maxillary sinusitis    ED Discharge Orders        Ordered    amoxicillin-clavulanate (AUGMENTIN) 875-125 MG tablet  2 times daily     03/12/17 1733       Controlled Substance Prescriptions [place] Controlled Substance Registry consulted? Not Applicable   Rolla Platehelps, [place] Denner O, PA-C 03/12/17 1743

## 2017-03-12 NOTE — ED Triage Notes (Signed)
Pt reports sinus pain/pressure x1 week. Has tried OTC nasal decongestants with no relief. She reports most of her pain is in her upper jaw/sinus area.

## 2017-04-22 ENCOUNTER — Encounter: Payer: Self-pay | Admitting: Sports Medicine

## 2017-07-07 ENCOUNTER — Encounter: Payer: Self-pay | Admitting: Sports Medicine

## 2017-07-07 ENCOUNTER — Ambulatory Visit: Payer: BLUE CROSS/BLUE SHIELD | Admitting: Sports Medicine

## 2017-07-07 DIAGNOSIS — J0100 Acute maxillary sinusitis, unspecified: Secondary | ICD-10-CM | POA: Diagnosis not present

## 2017-07-07 MED ORDER — AMOXICILLIN-POT CLAVULANATE 875-125 MG PO TABS: 1.0000 | ORAL_TABLET | Freq: Two times a day (BID) | ORAL | 0 refills | Status: AC

## 2017-07-07 MED ORDER — FLUCONAZOLE 150 MG PO TABS: 150.0000 mg | ORAL_TABLET | Freq: Once | ORAL | 0 refills | Status: AC

## 2017-07-07 MED ORDER — HYDROCOD POLST-CPM POLST ER 10-8 MG/5ML PO SUER: 5.0000 mL | Freq: Two times a day (BID) | ORAL | 0 refills | Status: DC | PRN

## 2017-07-07 MED ORDER — PREDNISONE 50 MG PO TABS: 50.0000 mg | ORAL_TABLET | Freq: Every day | ORAL | 0 refills | Status: DC

## 2017-07-07 NOTE — Progress Notes (Signed)
Subjective:    CC: Feeling sick  HPI: This is a pleasant 42 year old female with a history of maxillary sinus infections, for the past several days she has had worsening sinus pain and pressure with radiation to the ears, cough, fevers and chills over 100 F.  Cough is for the most part nonproductive, no GI symptoms, no skin rash.  Moderate sore throat particularly on the right.  I reviewed the past medical history, family history, social history, surgical history, and allergies today and no changes were needed.  Please see the problem list section below in epic for further details.  Past Medical History: Past Medical History:  Diagnosis Date  . Obesity    Past Surgical History: Past Surgical History:  Procedure Laterality Date  . CESAREAN SECTION    . TONSILLECTOMY     Social History: Social History   Socioeconomic History  . Marital status: Married    Spouse name: Not on file  . Number of children: Not on file  . Years of education: Not on file  . Highest education level: Not on file  Occupational History  . Not on file  Social Needs  . Financial resource strain: Not on file  . Food insecurity:    Worry: Not on file    Inability: Not on file  . Transportation needs:    Medical: Not on file    Non-medical: Not on file  Tobacco Use  . Smoking status: Former Smoker    Last attempt to quit: 06/23/1992    Years since quitting: 25.0  . Smokeless tobacco: Never Used  Substance and Sexual Activity  . Alcohol use: Yes    Comment: rarely about once a year  . Drug use: No  . Sexual activity: Not on file  Lifestyle  . Physical activity:    Days per week: Not on file    Minutes per session: Not on file  . Stress: Not on file  Relationships  . Social connections:    Talks on phone: Not on file    Gets together: Not on file    Attends religious service: Not on file    Active member of club or organization: Not on file    Attends meetings of clubs or organizations: Not  on file    Relationship status: Not on file  Other Topics Concern  . Not on file  Social History Narrative  . Not on file   Family History: Family History  Problem Relation Age of Onset  . Diabetes Mother   . Cancer Mother        breast  . Hyperlipidemia Father   . Hypertension Father    Allergies: Allergies  Allergen Reactions  . Naproxen Anaphylaxis    "my throat swells up"  . Naproxen Sodium Anaphylaxis    "my throat swells up."   Medications: See med rec.  Review of Systems: No fevers, chills, night sweats, weight loss, chest pain, or shortness of breath.   Objective:    General: Well Developed, well nourished, and in no acute distress.  Neuro: Alert and oriented x3, extra-ocular muscles intact, sensation grossly intact.  HEENT: Normocephalic, atraumatic, pupils equal round reactive to light, neck supple, no masses, no lymphadenopathy, thyroid nonpalpable.  Ear canals unremarkable, nasopharynx unremarkable, tender over the maxillary sinuses, she does have some pharyngeal erythema and swelling on the right, no overt peritonsillar abscess.  Tenderness at the right mandibular angle. Skin: Warm and dry, no rashes. Cardiac: Regular rate and rhythm, no murmurs  rubs or gallops, no lower extremity edema.  Respiratory: Clear to auscultation bilaterally. Not using accessory muscles, speaking in full sentences.  Impression and Recommendations:    Maxillary sinusitis Recurrent bilateral maxillary sinusitis. She does have some peritonsillar edema on the right with mild swelling but not sufficient to suggest a peritonsillar abscess. Augmentin, prednisone, Tussionex. If no better by the middle of the week we will need a CT with IV contrast of her neck and soft tissues. Frequent candidal infections with antibiotics, adding some Diflucan to use in case.  ___________________________________________ Ihor Austinhomas J. Benjamin Stainhekkekandam, M.D., ABFM., CAQSM. Primary Care and Sports Medicine [facility]  Adjunct Instructor of Family Medicine  [employer]

## 2017-07-07 NOTE — Assessment & Plan Note (Addendum)
Recurrent bilateral maxillary sinusitis. She does have some peritonsillar edema on the right with mild swelling but not sufficient to suggest a peritonsillar abscess. Augmentin, prednisone, Tussionex. If no better by the middle of the week we will need a CT with IV contrast of her neck and soft tissues. Frequent candidal infections with antibiotics, adding some Diflucan to use in case.

## 2017-07-18 ENCOUNTER — Encounter: Payer: Self-pay | Admitting: Sports Medicine

## 2017-07-18 ENCOUNTER — Ambulatory Visit (INDEPENDENT_AMBULATORY_CARE_PROVIDER_SITE_OTHER): Payer: BLUE CROSS/BLUE SHIELD

## 2017-07-18 ENCOUNTER — Ambulatory Visit: Payer: BLUE CROSS/BLUE SHIELD | Admitting: Sports Medicine

## 2017-07-18 DIAGNOSIS — R220 Localized swelling, mass and lump, head: Secondary | ICD-10-CM

## 2017-07-18 DIAGNOSIS — J0101 Acute recurrent maxillary sinusitis: Secondary | ICD-10-CM | POA: Diagnosis not present

## 2017-07-18 LAB — COMPREHENSIVE METABOLIC PANEL
ALT: 28 U/L (ref 6–29)
AST: 11 U/L (ref 10–30)
Albumin: 4.6 g/dL (ref 3.6–5.1)
Alkaline phosphatase (APISO): 62 U/L (ref 33–115)
BUN: 23 mg/dL (ref 7–25)
Calcium: 9.6 mg/dL (ref 8.6–10.2)
Chloride: 102 mmol/L (ref 98–110)
Creat: 0.85 mg/dL (ref 0.50–1.10)
Glucose, Bld: 111 mg/dL — ABNORMAL HIGH (ref 65–99)
Potassium: 4.8 mmol/L (ref 3.5–5.3)
Total Protein: 7.3 g/dL (ref 6.1–8.1)

## 2017-07-18 LAB — CBC WITH DIFFERENTIAL/PLATELET
Basophils Absolute: 48 {cells}/uL (ref 0–200)
Basophils Relative: 0.7 %
Eosinophils Absolute: 61 cells/uL (ref 15–500)
Eosinophils Relative: 0.9 %
HCT: 42.1 % (ref 35.0–45.0)
Hemoglobin: 15.3 g/dL (ref 11.7–15.5)
Lymphs Abs: 1530 {cells}/uL (ref 850–3900)
MCH: 31.3 pg (ref 27.0–33.0)
MCHC: 36.3 g/dL — ABNORMAL HIGH (ref 32.0–36.0)
MCV: 86.1 fL (ref 80.0–100.0)
MPV: 9.1 fL (ref 7.5–12.5)
Monocytes Relative: 7.8 %
Neutro Abs: 4631 {cells}/uL (ref 1500–7800)
Neutrophils Relative %: 68.1 %
Platelets: 354 10*3/uL (ref 140–400)
RBC: 4.89 10*6/uL (ref 3.80–5.10)
RDW: 12.2 % (ref 11.0–15.0)
Total Lymphocyte: 22.5 %
WBC mixed population: 530 cells/uL (ref 200–950)
WBC: 6.8 10*3/uL (ref 3.8–10.8)

## 2017-07-18 LAB — COMPREHENSIVE METABOLIC PANEL WITH GFR
AG Ratio: 1.7 (calc) (ref 1.0–2.5)
CO2: 28 mmol/L (ref 20–32)
Globulin: 2.7 g/dL (ref 1.9–3.7)
Sodium: 137 mmol/L (ref 135–146)
Total Bilirubin: 0.8 mg/dL (ref 0.2–1.2)

## 2017-07-18 MED ORDER — FLUCONAZOLE 150 MG PO TABS: 150.0000 mg | ORAL_TABLET | Freq: Once | ORAL | 0 refills | Status: AC

## 2017-07-18 MED ORDER — AZITHROMYCIN 250 MG PO TABS: ORAL_TABLET | ORAL | 0 refills | Status: DC

## 2017-07-18 MED ORDER — AMOXICILLIN-POT CLAVULANATE 875-125 MG PO TABS: 1.0000 | ORAL_TABLET | Freq: Two times a day (BID) | ORAL | 0 refills | Status: DC

## 2017-07-18 MED ORDER — IOPAMIDOL (ISOVUE-300) INJECTION 61%
100.0000 mL | Freq: Once | INTRAVENOUS | Status: AC | PRN
  Administered 2017-07-18: 75 mL via INTRAVENOUS

## 2017-07-18 NOTE — Assessment & Plan Note (Signed)
I do think she is at the tail end of her maxillary sinusitis. Still having some malaise, cough as expected. Checking some labs, but because of her persistent right tonsillar pillar swelling, and facial pressure we will add a CT with IV contrast of her neck and face. Adding Azithro, more Diflucan.

## 2017-07-18 NOTE — Progress Notes (Signed)
Subjective:    CC: Persistent illness  HPI: I saw Breanna [name] a few weeks ago for what seemed to be a acute maxillary sinusitis, she was very ill, febrile.  She did have some swelling in her right peritonsillar region.  We did Augmentin, she returns today with persistent symptoms, severe sore throat, facial and maxillary pressure.  A little bit of epistaxis.  Moderate fatigue.  Symptoms are moderate, persistent.  I reviewed the past medical history, family history, social history, surgical history, and allergies today and no changes were needed.  Please see the problem list section below in epic for further details.  Past Medical History: Past Medical History:  Diagnosis Date  . Obesity    Past Surgical History: Past Surgical History:  Procedure Laterality Date  . CESAREAN SECTION    . TONSILLECTOMY     Social History: Social History   Socioeconomic History  . Marital status: Married    Spouse name: Not on file  . Number of children: Not on file  . Years of education: Not on file  . Highest education level: Not on file  Occupational History  . Not on file  Social Needs  . Financial resource strain: Not on file  . Food insecurity:    Worry: Not on file    Inability: Not on file  . Transportation needs:    Medical: Not on file    Non-medical: Not on file  Tobacco Use  . Smoking status: Former Smoker    Last attempt to quit: 06/23/1992    Years since quitting: 25.0  . Smokeless tobacco: Never Used  Substance and Sexual Activity  . Alcohol use: Yes    Comment: rarely about once a year  . Drug use: No  . Sexual activity: Not on file  Lifestyle  . Physical activity:    Days per week: Not on file    Minutes per session: Not on file  . Stress: Not on file  Relationships  . Social connections:    Talks on phone: Not on file    Gets together: Not on file    Attends religious service: Not on file    Active member of club or organization: Not on file    Attends meetings  of clubs or organizations: Not on file    Relationship status: Not on file  Other Topics Concern  . Not on file  Social History Narrative  . Not on file   Family History: Family History  Problem Relation Age of Onset  . Diabetes Mother   . Cancer Mother        breast  . Hyperlipidemia Father   . Hypertension Father    Allergies: Allergies  Allergen Reactions  . Naproxen Anaphylaxis    "my throat swells up"  . Naproxen Sodium Anaphylaxis    "my throat swells up."   Medications: See med rec.  Review of Systems: No fevers, chills, night sweats, weight loss, chest pain, or shortness of breath.   Objective:    General: Well Developed, well nourished, and in no acute distress.  Neuro: Alert and oriented x3, extra-ocular muscles intact, sensation grossly intact.  HEENT: Normocephalic, atraumatic, pupils equal round reactive to light, neck supple, no masses, no lymphadenopathy, thyroid nonpalpable.  Still with mild tenderness over the frontal and maxillary sinuses, oropharynx shows right peritonsillar swelling and erythema. Skin: Warm and dry, no rashes. Cardiac: Regular rate and rhythm, no murmurs rubs or gallops, no lower extremity edema.  Respiratory: Clear to auscultation  bilaterally. Not using accessory muscles, speaking in full sentences.  Impression and Recommendations:    Maxillary sinusitis I do think she is at the tail end of her maxillary sinusitis. Still having some malaise, cough as expected. Checking some labs, but because of her persistent right tonsillar pillar swelling, and facial pressure we will add a CT with IV contrast of her neck and face. Adding Azithro, more Diflucan.  I spent 25 minutes with this patient, greater than 50% was face-to-face time counseling regarding the above diagnoses ___________________________________________ Ihor Austin. Benjamin Stain, M.D., ABFM., CAQSM. Primary Care and Sports Medicine [facility]  Adjunct  Instructor of Family Medicine  [employer]

## 2017-09-12 IMAGING — MR MR HIP*L* W/CM
5 series · 40 of 40 positions shown · IV contrast (agent unspecified)
Comparison: None.

CLINICAL DATA: No specific injury. No surgery. Pain with movement
for 2-3 months.

EXAM:
MRI OF THE LEFT HIP WITH CONTRAST(MR Arthrogram)
TECHNIQUE: Multiplanar, multisequence MR imaging of the hip was performed
immediately following contrast injection into the hip joint under
fluoroscopic guidance. No intravenous contrast was administered.

[Series 4: T1 fat-sat · coronal · 4.0mm · 0.59mm/px · 5 of 23 slices shown (1 of 3)]
[im 1/23]
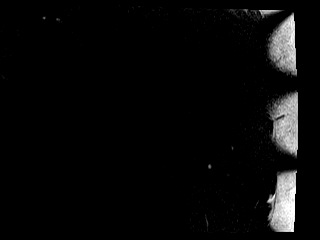
[im 6/23]
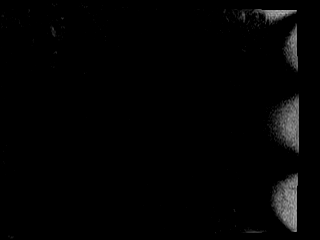
[im 12/23]
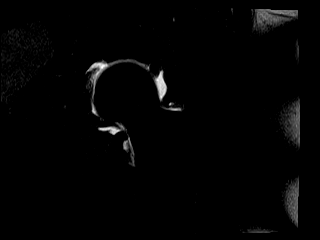
[im 17/23]
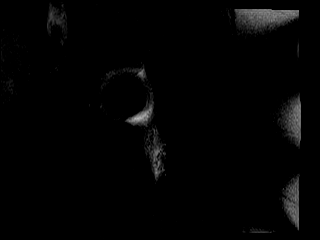
[im 23/23]
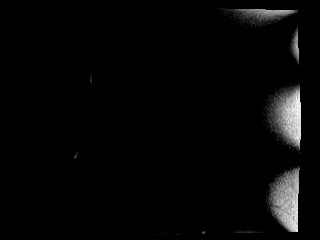

[Series 5: T1 fat-sat · sagittal · 4.0mm · 0.59mm/px · 8 of 28 slices shown (2 of 3)]
[im 1/28]
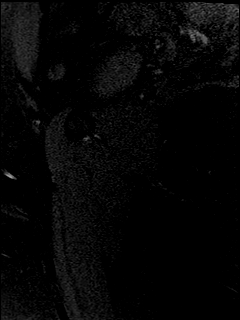
[im 4/28]
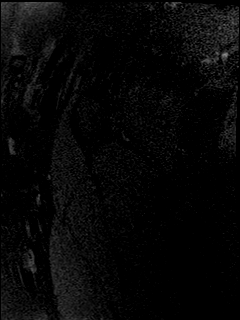
[im 8/28]
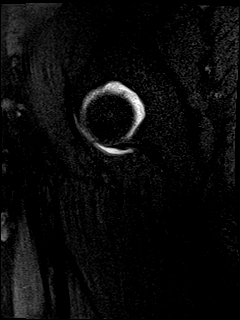
[im 12/28]
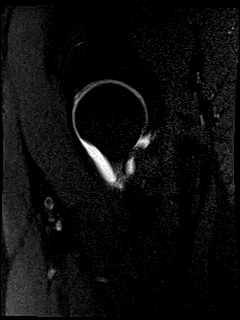
[im 16/28]
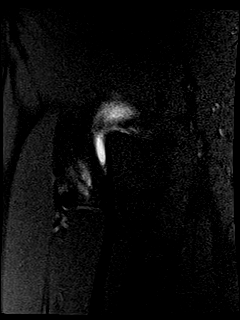
[im 20/28]
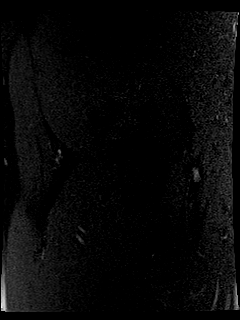
[im 24/28]
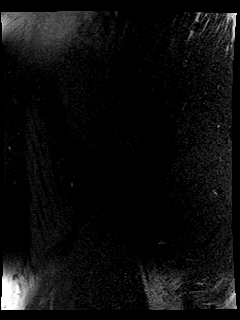
[im 28/28]
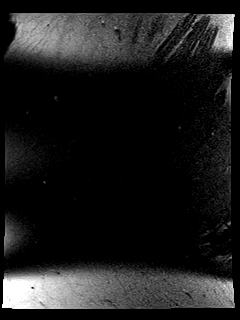

[Series 6: T1 · coronal · 4.0mm · 0.85mm/px · 10 of 35 slices shown]
[im 1/35]
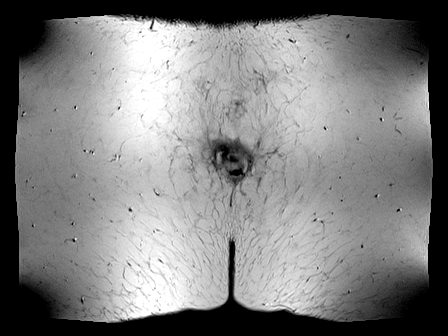
[im 4/35]
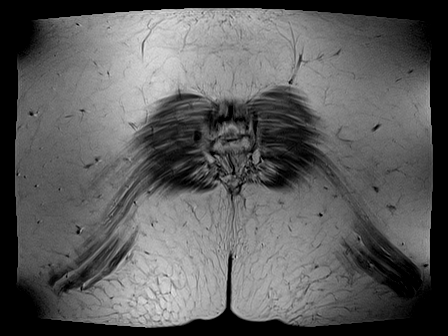
[im 8/35]
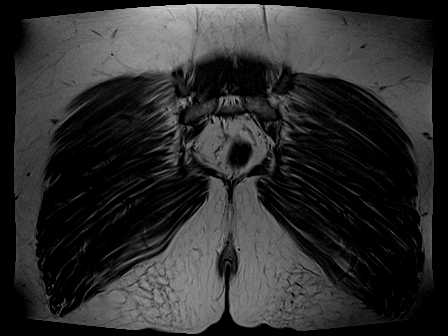
[im 12/35]
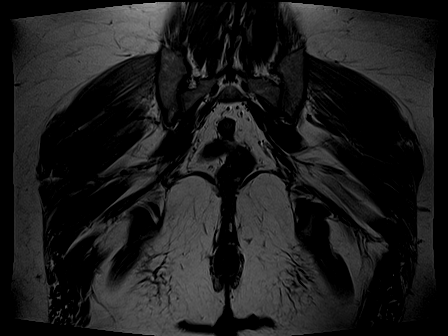
[im 16/35]
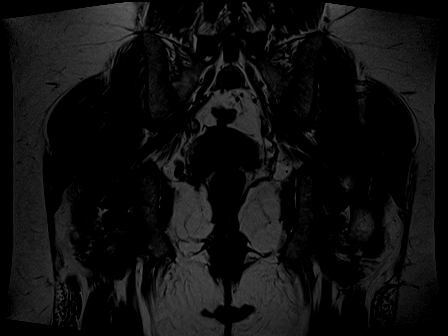
[im 19/35]
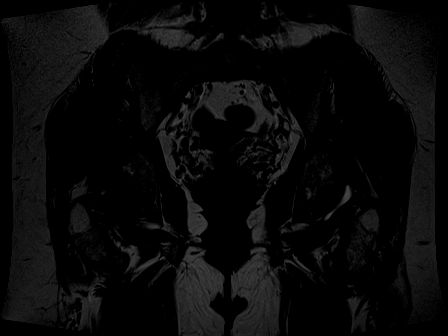
[im 23/35]
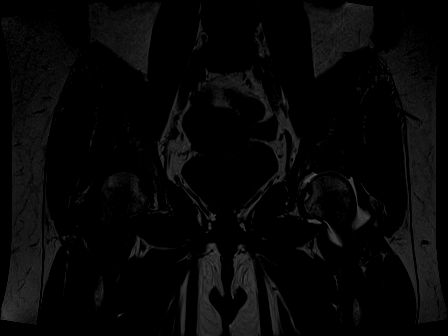
[im 27/35]
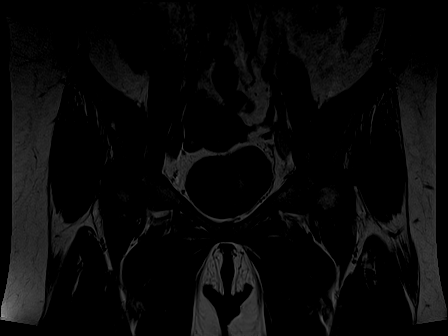
[im 31/35]
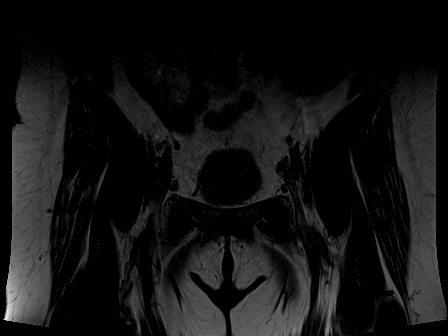
[im 35/35]
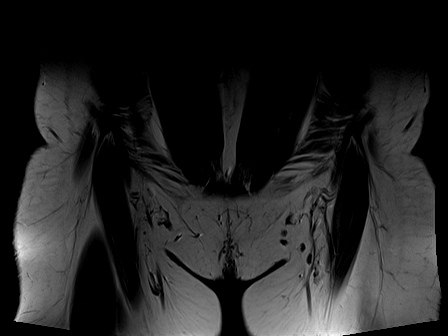

[Series 7: T2 fat-sat · coronal · 4.0mm · 0.99mm/px · 10 of 35 slices shown]
[im 1/35]
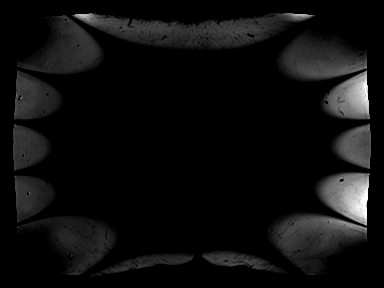
[im 4/35]
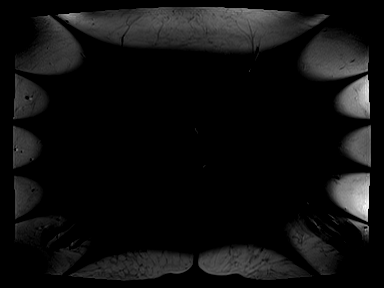
[im 8/35]
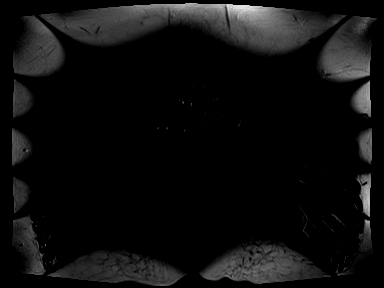
[im 12/35]
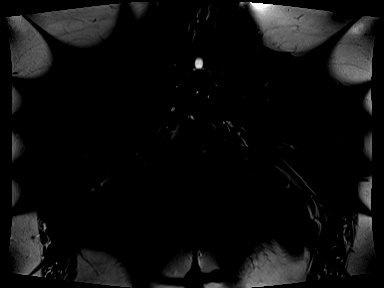
[im 16/35]
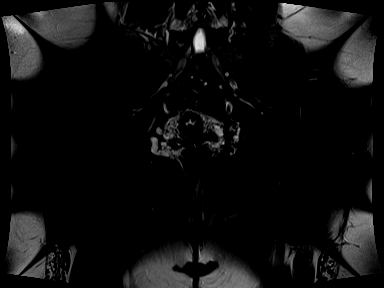
[im 19/35]
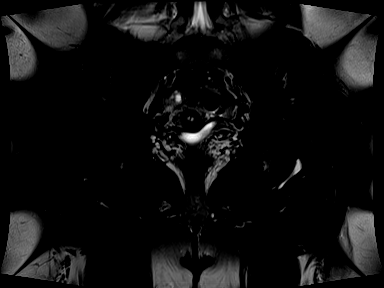
[im 23/35]
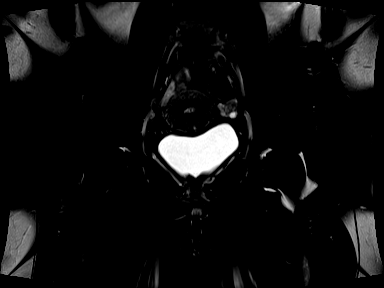
[im 27/35]
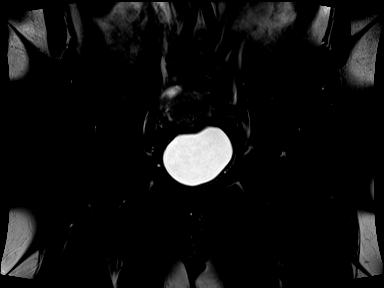
[im 31/35]
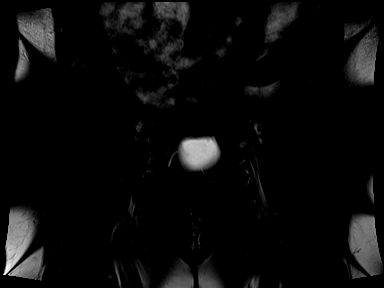
[im 35/35]
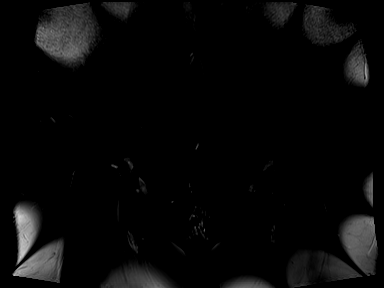

[Series 8: T1 fat-sat · axial · 4.0mm · 0.59mm/px · z∈[-21,+99]mm · 7 of 25 slices shown (3 of 3)]
[im 1/25]
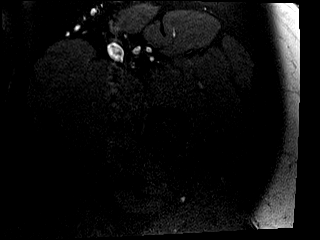
[im 5/25]
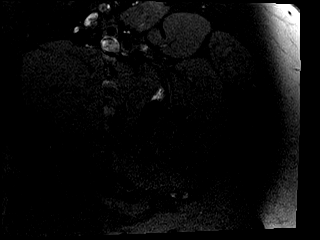
[im 9/25]
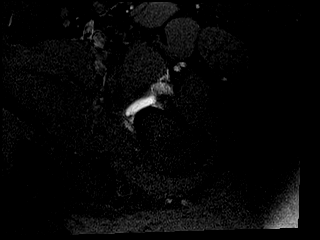
[im 13/25]
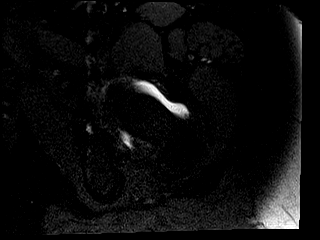
[im 17/25]
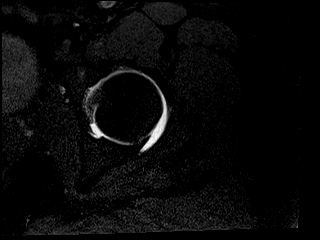
[im 21/25]
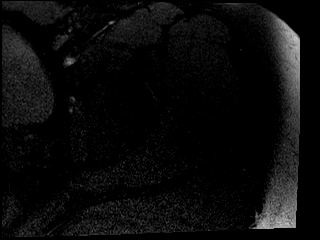
[im 25/25]
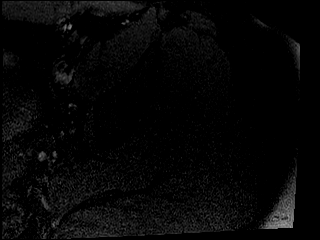

[40 of 40 positions shown; findings below may reference images not displayed]

FINDINGS: Bone

No hip fracture, dislocation or avascular necrosis. Normal sacrum
and sacroiliac joints. No aggressive osseous lesion. No periosteal
reaction or bone destruction.

Degenerative disc disease with disc height loss at L5-S1.

Alignment

Normal. No subluxation.

Dysplasia

None.

Joint effusion

Intraarticular contrast in the left hip joint distending the joint
capsule. No right hip joint effusion. No SI joint effusion.

Labrum

Mild degeneration of the superior anterior left labrum. No discrete
labral tear.

Cartilage

Femoral cartilage: Normal.

Acetabular cartilage: Normal.

Capsule and ligaments

Normal.

Muscles and Tendons

Flexors: Normal.

Extensors: Normal.

Abductors: Normal.

Adductors: Normal.

Rotators: Normal.

Hamstrings: Normal.

Other Findings

None

Viscera

Normal. No abnormality seen in pelvis. No lymphadenopathy. No free
fluid in the pelvis.
IMPRESSION: 1. Mild degeneration of the superior anterior left labrum. No
discrete labral tear.

## 2017-10-01 ENCOUNTER — Encounter: Payer: Self-pay | Admitting: Physician Assistant

## 2017-10-01 ENCOUNTER — Ambulatory Visit: Payer: BLUE CROSS/BLUE SHIELD | Admitting: Physician Assistant

## 2017-10-01 VITALS — BP 123/85 | HR 96 | Temp 98.3°F | Wt 213.0 lb

## 2017-10-01 DIAGNOSIS — K648 Other hemorrhoids: Secondary | ICD-10-CM | POA: Insufficient documentation

## 2017-10-01 DIAGNOSIS — K625 Hemorrhage of anus and rectum: Secondary | ICD-10-CM | POA: Diagnosis not present

## 2017-10-01 MED ORDER — HYDROCORTISONE ACETATE 25 MG RE SUPP: 25.0000 mg | Freq: Two times a day (BID) | RECTAL | 0 refills | Status: DC | PRN

## 2017-10-01 MED ORDER — DOCUSATE SODIUM 100 MG PO CAPS: 100.0000 mg | ORAL_CAPSULE | Freq: Two times a day (BID) | ORAL | 3 refills | Status: DC | PRN

## 2017-10-01 NOTE — Progress Notes (Signed)
HPI:                                                                Breanna [name] is a 42 y.o. female who presents to [name]: Primary Care Sports Medicine today for rectal bleeding  New onset rectal bleeding beginning this morning. Reports normal BM this morning, since that time has had painless, persistent bright red blood per rectum. No associated constitutional symptoms, change in bowel habits, abdominal pain, tenesmus, nausea/vomiting Denies firm, hard to pass stools or straining   No flowsheet data found.    Past Medical History:  Diagnosis Date  . Obesity    Past Surgical History:  Procedure Laterality Date  . CESAREAN SECTION    . TONSILLECTOMY     Social History   Tobacco Use  . Smoking status: Former Smoker    Last attempt to quit: 06/23/1992    Years since quitting: 25.2  . Smokeless tobacco: Never Used  Substance Use Topics  . Alcohol use: Yes    Comment: rarely about once a year   family history includes Cancer in her mother; Diabetes in her mother; Hyperlipidemia in her father; Hypertension in her father.    ROS: negative except as noted in the HPI  Medications: Current Outpatient Medications  Medication Sig Dispense Refill  . docusate sodium (COLACE) 100 MG capsule Take 1 capsule (100 mg total) by mouth 2 (two) times daily as needed. 90 capsule 3  . hydrocortisone (ANUSOL-HC) 25 MG suppository Place 1 suppository (25 mg total) rectally 2 (two) times daily as needed for hemorrhoids. 12 suppository 0   No current facility-administered medications for this visit.    Allergies  Allergen Reactions  . Naproxen Anaphylaxis    "my throat swells up"  . Naproxen Sodium Anaphylaxis    "my throat swells up."       Objective:  BP 123/85   Pulse 96   Temp 98.3 F (36.8 C) (Oral)   Wt 213 lb (96.6 kg)   BMI 36.56 kg/m  Gen:  alert, not ill-appearing, no distress, appropriate for age, obese female HEENT: head normocephalic  without obvious abnormality, conjunctiva and cornea clear, wearing glasses, trachea midline Pulm: Normal work of breathing, normal phonation GI: abdomen soft, non-tender, no Anorectal: in the right anterolateral position there is a slight bulge of the rectal mucosa with a reddish-purple hued mass, no blood in the rectal vault   Procedure Note: Anoscopy Indication: hematochezia Verbal consent obtained Patient positioned in the right lateral position Visual assessment revealed: no abnormalities DRE performed with lubricated index finger. Normal sphincter tone and no palpable abnormalities Anoscope inserted into the anus Obturator removed to examine anal mucosa in the right anterolateral position there is a slight bulge of the rectal mucosa with a reddish-purple hued mass, no blood in the rectal vault Anoscope gently removed to visualize all sides of the anal canal  Chaperoned by: Breanna [name], CMA    No results found for this or any previous visit (from the past 72 hour(s)). No results found.    Assessment and Plan: 42 y.o. female with   .Breanna [name] was seen today for rectal bleeding.  Diagnoses and all orders for this visit:  Rectal bleeding -     hydrocortisone (ANUSOL-HC) 25  MG suppository; Place 1 suppository (25 mg total) rectally 2 (two) times daily as needed for hemorrhoids. -     docusate sodium (COLACE) 100 MG capsule; Take 1 capsule (100 mg total) by mouth 2 (two) times daily as needed.  Internal hemorrhoid, bleeding   Anoscopy performed today for new onset BRBPR. Visualized bleeding hemorrhoid, estimate this is grade 1, bleeding was well-controlled on exam, conservative management with stool softener and suppository Counseled to avoid NSAIDs   Patient education and anticipatory guidance given Patient agrees with treatment plan Follow-up as needed if symptoms worsen or fail to improve  Levonne Hubert PA-C

## 2017-10-01 NOTE — Patient Instructions (Addendum)
   Avoid all NSAIDs including Advil, Ibuprofen, Aspirin etc.  Start stool softener daily (Colace/Docusate)  Use rectal suppository twice a day to help reduce bleeding  If symptoms persist or worsen, will refer you to GI   Rectal Bleeding Rectal bleeding is when blood passes out of the anus. People with rectal bleeding may notice bright red blood in their underwear or in the toilet after having a bowel movement. They may also have dark red or black stools. Rectal bleeding is usually a sign that something is wrong. Many things can cause rectal bleeding, including:  Hemorrhoids. These are blood vessels in the anus or rectum that are larger than normal.  Fistulas. These are abnormal passages in the rectum and anus.  Anal fissures. This is a tear in the anus.  Diverticulosis. This is a condition in which pockets or sacs project from the bowel.  Proctitis and colitis. These are conditions in which the rectum, colon, or anus become inflamed.  Polyps. These are growths that can be cancerous (malignant) or non-cancerous (benign).  Part of the rectum sticking out from the anus (rectal prolapse).  Certain medicines.  Intestinal infections.  Follow these instructions at home: Pay attention to any changes in your symptoms. Take these actions to help lessen bleeding and discomfort:  Eat a diet that is high in fiber. This will keep your stool soft, making it easier to pass stools without straining. Ask your health care provider what foods and drinks are high in fiber.  Drink enough fluid to keep your urine clear or pale yellow. This also helps to keep your stool soft.  Try taking a warm bath. This may help soothe any pain in your rectum.  Keep all follow-up visits as told by your health care provider. This is important.  Get help right away if:  You have new or increased rectal bleeding.  You have black or dark red stools.  You vomit blood or something that looks like coffee  grounds.  You have pain or tenderness in your abdomen.  You have a fever.  You feel weak.  You feel nauseous.  You faint.  You have severe pain in your rectum.  You cannot have a bowel movement. This information is not intended to replace advice given to you by your health care provider. Make sure you discuss any questions you have with your health care provider. Document Released: 07/06/2001 Document Revised: 06/22/2015 Document Reviewed: 03/12/2015 Elsevier Interactive Patient Education  [employer].

## 2017-12-12 DIAGNOSIS — Z1231 Encounter for screening mammogram for malignant neoplasm of breast: Secondary | ICD-10-CM | POA: Diagnosis not present

## 2017-12-12 DIAGNOSIS — Z01419 Encounter for gynecological examination (general) (routine) without abnormal findings: Secondary | ICD-10-CM | POA: Diagnosis not present

## 2017-12-12 DIAGNOSIS — A6000 Herpesviral infection of urogenital system, unspecified: Secondary | ICD-10-CM | POA: Diagnosis not present

## 2017-12-19 ENCOUNTER — Ambulatory Visit (INDEPENDENT_AMBULATORY_CARE_PROVIDER_SITE_OTHER): Payer: BLUE CROSS/BLUE SHIELD | Admitting: Physician Assistant

## 2017-12-19 ENCOUNTER — Encounter: Payer: Self-pay | Admitting: Physician Assistant

## 2017-12-19 VITALS — BP 125/72 | HR 88 | Temp 99.0°F | Ht 64.0 in | Wt 212.0 lb

## 2017-12-19 DIAGNOSIS — J0101 Acute recurrent maxillary sinusitis: Secondary | ICD-10-CM

## 2017-12-19 MED ORDER — FLUCONAZOLE 150 MG PO TABS: 150.0000 mg | ORAL_TABLET | Freq: Once | ORAL | 0 refills | Status: AC

## 2017-12-19 MED ORDER — AZITHROMYCIN 250 MG PO TABS: ORAL_TABLET | ORAL | 0 refills | Status: DC

## 2017-12-19 NOTE — Progress Notes (Signed)
Subjective:    Patient ID: Breanna [name], female    DOB: 1975/09/11, 42 y.o.   MRN: [id number]  HPI  Patient is a 42 year old female with a history of recurrent maxillary sinusitis.  Her last sinus infection was in June of this year.  She has a history of having at least 4-year.  She has tried using Flonase in the past but after about 5 days creates nosebleeds.  For this current episode she has been doing sinus rinses, Mucinex, Sudafed with little relief.  She has felt bad for the past 7 to 8 days.  She continues to have a lot of facial pressure and now her teeth are aching.  She denies any fever, chills, body aches.  Her ears do feel very popping.  She has been to ENT when she was younger.  They do not really do anything except remove her tonsils.  She did have complete resolution of symptoms since July.  .. Active Ambulatory Problems    Diagnosis Date Noted  . GENITAL HERPES 11/24/2009  . Cyst of breast, left, benign solitary 05/26/2012  . Facial rash 05/26/2012  . Obesity 05/26/2012  . Annual physical exam 05/26/2012  . Cervical radiculitis 06/25/2012  . Chronic idiopathic constipation 09/10/2012  . 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 Carpal tunnel syndrome, bilateral 04/15/2014  . Goiter 08/09/2014  . 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 Left leg swelling 06/13/2015  . Maxillary sinusitis 02/09/2016  . Hyperlipidemia 01/29/2017  . Skin tag of anus 02/24/2017  . Internal hemorrhoid, bleeding 10/01/2017   Resolved Ambulatory Problems    Diagnosis Date Noted  . HYPERHIDROSIS 11/24/2009  . WOUND, OPEN, HAND, WITHOUT COMPLICATIONS 06/04/2010  . Skin lesion 07/23/2012  . Acute pharyngitis 08/13/2013   No Additional Past Medical History      Review of Systems See HPI.     Objective:   Physical Exam  Constitutional: She is oriented to person, place, and time. She appears well-developed and well-nourished.  HENT:  Head: Normocephalic and atraumatic.   Right Ear: External ear normal.  Left Ear: External ear normal.  TM erythematous.  Oropharynx erythematous with PND. No tonsils present.  Bilateral nasal turbinates red and swollen.  Tenderness over maxillary sinuses.   Eyes: Conjunctivae are normal. Right eye exhibits no discharge. Left eye exhibits no discharge.  Cardiovascular: Normal rate and regular rhythm.  Pulmonary/Chest: Effort normal and breath sounds normal.  Lymphadenopathy:    She has cervical adenopathy.  Neurological: She is alert and oriented to person, place, and time.  Psychiatric: She has a normal mood and affect. Her behavior is normal.          Assessment & Plan:  Marland KitchenMarland KitchenDiagnoses and all orders for this visit:  Recurrent maxillary sinusitis -     azithromycin (ZITHROMAX) 250 MG tablet; Take 2 tablets now and then one tablet for 4 days.  Other orders -     fluconazole (DIFLUCAN) 150 MG tablet; Take 1 tablet (150 mg total) by mouth once for 1 dose.  I do think she has a sinus infection.  We spent a lot of time discussing how to prevent the sinus infections.  Unfortunately she cannot take Flonase very long without get nosebleeds.  I did discuss perhaps using it for 3 days at the very onset of symptoms.  Encourage nasal saline sinus rinses regularly.  Certainly we could consider an ENT referral in the future if her recurrence he is more than for a year.  Treated with  Z-Pak today as Augmentin did not work in June.  She does get yeast infections after antibiotic usage.  I did send over some Diflucan.  Handout for symptomatic care also given.

## 2017-12-19 NOTE — Patient Instructions (Signed)

## 2018-03-08 ENCOUNTER — Encounter: Payer: Self-pay | Admitting: Sports Medicine

## 2018-03-09 MED ORDER — ALPRAZOLAM 0.25 MG PO TABS: 0.2500 mg | ORAL_TABLET | Freq: Two times a day (BID) | ORAL | 0 refills | Status: DC | PRN

## 2018-03-10 ENCOUNTER — Other Ambulatory Visit: Payer: Self-pay

## 2018-03-10 ENCOUNTER — Emergency Department
Admission: EM | Admit: 2018-03-10 | Discharge: 2018-03-10 | Disposition: A | Discharge status: Home / Self Care | Payer: BLUE CROSS/BLUE SHIELD | Source: Home / Self Care

## 2018-03-10 ENCOUNTER — Encounter: Payer: Self-pay | Admitting: *Deleted

## 2018-03-10 DIAGNOSIS — N3000 Acute cystitis without hematuria: Secondary | ICD-10-CM

## 2018-03-10 DIAGNOSIS — R300 Dysuria: Secondary | ICD-10-CM | POA: Diagnosis not present

## 2018-03-10 HISTORY — DX: Herpesviral infection of urogenital system, unspecified: A60.00

## 2018-03-10 HISTORY — DX: Hyperlipidemia, unspecified: E78.5

## 2018-03-10 LAB — POCT URINALYSIS DIP (MANUAL ENTRY)
BILIRUBIN UA: NEGATIVE
GLUCOSE UA: NEGATIVE mg/dL
Ketones, POC UA: NEGATIVE mg/dL
NITRITE UA: POSITIVE — AB
Protein Ur, POC: 30 mg/dL — AB
Spec Grav, UA: 1.01 (ref 1.010–1.025)
Urobilinogen, UA: 0.2 E.U./dL
pH, UA: 5.5 (ref 5.0–8.0)

## 2018-03-10 MED ORDER — NITROFURANTOIN MONOHYD MACRO 100 MG PO CAPS: 100.0000 mg | ORAL_CAPSULE | Freq: Two times a day (BID) | ORAL | 0 refills | Status: DC

## 2018-03-10 NOTE — ED Provider Notes (Signed)
[facility]    CSN: [id number] Arrival date & time: 03/10/18  0808     History   Chief Complaint Chief Complaint  Patient presents with  . Dysuria  Patient has had dysuria since about Sunday with frequency and urgency and dripping  HPI Breanna [name] is a 43 y.o. female.   HPI Has a history of having had some urinary tract infection to the past, but is been a couple of years.  She has not had any significant systemic symptoms from this.  Her temperature was 99 one time.  She has had a lot of dysuria and frequency.  No blood.  Lower abdominal pain.  Is not diabetic.  Other than allergies no other major problems.  Not allergic to any medications of significance. Past Medical History:  Diagnosis Date  . Genital herpes   . Hyperlipidemia   . Obesity     Patient Active Problem List   Diagnosis Date Noted  . Internal hemorrhoid, bleeding 10/01/2017  . Skin tag of anus 02/24/2017  . Hyperlipidemia 01/29/2017  . Maxillary sinusitis 02/09/2016  . 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 Left leg swelling 06/13/2015  . Goiter 08/09/2014  . Carpal tunnel syndrome, bilateral 04/15/2014  . 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 Chronic idiopathic constipation 09/10/2012  . Cervical radiculitis 06/25/2012  . Cyst of breast, left, benign solitary 05/26/2012  . Facial rash 05/26/2012  . Obesity 05/26/2012  . Annual physical exam 05/26/2012  . GENITAL HERPES 11/24/2009    Past Surgical History:  Procedure Laterality Date  . CESAREAN SECTION    . TONSILLECTOMY      OB History   No obstetric history on file.      Home Medications    Prior to Admission medications   Medication Sig Start Date End Date Taking? Authorizing Provider  Triamcinolone Acetonide (NASACORT ALLERGY 24HR NA) Place into the nose.   Yes [provider]  ALPRAZolam (XANAX) 0.25 MG tablet Take 1 tablet (0.25 mg total) by mouth 2 (two) times daily as needed for anxiety.  03/09/18   Monica Becton, MD  nitrofurantoin, macrocrystal-monohydrate, (MACROBID) 100 MG capsule Take 1 capsule (100 mg total) by mouth 2 (two) times daily. 03/10/18   Peyton Najjar, MD    Family History Family History  Problem Relation Age of Onset  . Diabetes Mother   . Cancer Mother        breast  . Hyperlipidemia Father   . Hypertension Father     Social History Social History   Tobacco Use  . Smoking status: Former Smoker    Last attempt to quit: 06/23/1992    Years since quitting: 25.7  . Smokeless tobacco: Never Used  Substance Use Topics  . Alcohol use: Yes    Comment: rarely about once a year  . Drug use: No     Allergies   Naproxen and Naproxen sodium   Review of Systems Review of Systems No nausea or vomiting.  No systemic illness.  No flank pain.  No chills.  Physical Exam Triage Vital Signs ED Triage Vitals  Enc Vitals Group     BP      Pulse      Resp      Temp      Temp src      SpO2      Weight      Height      Head Circumference      Peak Flow  Pain Score      Pain Loc      Pain Edu?      Excl. in GC?    No data found.  Updated Vital Signs BP 121/83 (BP Location: Right Arm)   Pulse 91   Temp 98.1 F (36.7 C) (Oral)   Resp 18   Ht 5\' 4"  (1.626 m)   Wt 97.1 kg   LMP 03/06/2018   SpO2 98%   BMI 36.73 kg/m   Visual Acuity Right Eye Distance:   Left Eye Distance:   Bilateral Distance:    Right Eye Near:   Left Eye Near:    Bilateral Near:     Physical Exam Pleasant lady, overweight, no acute distress.  Abdomen is soft without mass or tenderness.  No CVA tenderness.  UC Treatments / Results  Labs (all labs ordered are listed, but only abnormal results are displayed) Labs Reviewed  POCT URINALYSIS DIP (MANUAL ENTRY) - Abnormal; Notable for the following components:      Result Value   Clarity, UA cloudy (*)    Blood, UA moderate (*)    Protein Ur, POC =30 (*)    Nitrite, UA Positive (*)    Leukocytes,  UA Large (3+) (*)    All other components within normal limits  URINE CULTURE  Urinalysis was positive with large leukocytes and positive nitrate.  EKG None  Radiology No results found.  Procedures Procedures (including critical care time) None Medications Ordered in UC Medications - No data to display  Initial Impression / Assessment and Plan / UC Course  I have reviewed the triage vital signs and the nursing notes.  Pertinent labs & imaging results that were available during my care of the patient were reviewed by me and considered in my medical decision making (see chart for details).     Urinary tract infection.  Discussed with her causes and cautions. Final Clinical Impressions(s) / UC Diagnoses   Final diagnoses:  Dysuria  Acute cystitis without hematuria     Discharge Instructions     Drink plenty of fluids  Take the nitrofurantoin 1 twice daily for 5 days  We will let you know if concerns arise regarding the culture  If more systemic illness such as fevers, chills, flank pain, nausea and vomiting, or other things of concern arise please return or go to the emergency room.    ED Prescriptions    Medication Sig Dispense Auth. Provider   nitrofurantoin, macrocrystal-monohydrate, (MACROBID) 100 MG capsule Take 1 capsule (100 mg total) by mouth 2 (two) times daily. 10 capsule Peyton Najjar, MD     Controlled Substance Prescriptions [place] Controlled Substance Registry consulted? No   Peyton Najjar, MD 03/10/18 [phone]

## 2018-03-10 NOTE — Discharge Instructions (Signed)
Drink plenty of fluids  Take the nitrofurantoin 1 twice daily for 5 days  We will let you know if concerns arise regarding the culture  If more systemic illness such as fevers, chills, flank pain, nausea and vomiting, or other things of concern arise please return or go to the emergency room.

## 2018-03-10 NOTE — ED Triage Notes (Signed)
Pt c/o dysuria x 2 days. Denies fever.  

## 2018-03-12 ENCOUNTER — Telehealth: Payer: Self-pay

## 2018-03-12 LAB — URINE CULTURE
MICRO NUMBER:: 179667
SPECIMEN QUALITY: ADEQUATE

## 2018-03-12 NOTE — Telephone Encounter (Signed)
Pt given UCX results. Advised to finish entire course of antibiotics. Pt acknowledged.

## 2018-07-01 ENCOUNTER — Other Ambulatory Visit: Payer: Self-pay | Admitting: Sports Medicine

## 2018-07-01 NOTE — Telephone Encounter (Signed)
Called patient and she will schedule a Doxemity Visit when she gets to her calender at home.

## 2018-07-01 NOTE — Telephone Encounter (Signed)
Last OV was 11/2017. Please advise

## 2018-07-21 ENCOUNTER — Ambulatory Visit (INDEPENDENT_AMBULATORY_CARE_PROVIDER_SITE_OTHER): Payer: BC Managed Care – PPO | Admitting: Sports Medicine

## 2018-07-21 ENCOUNTER — Encounter: Payer: Self-pay | Admitting: Sports Medicine

## 2018-07-21 DIAGNOSIS — E669 Obesity, unspecified: Secondary | ICD-10-CM

## 2018-07-21 DIAGNOSIS — Z0000 Encounter for general adult medical examination without abnormal findings: Secondary | ICD-10-CM | POA: Diagnosis not present

## 2018-07-21 DIAGNOSIS — F329 Major depressive disorder, single episode, unspecified: Secondary | ICD-10-CM

## 2018-07-21 DIAGNOSIS — N920 Excessive and frequent menstruation with regular cycle: Secondary | ICD-10-CM

## 2018-07-21 DIAGNOSIS — F419 Anxiety disorder, unspecified: Secondary | ICD-10-CM

## 2018-07-21 DIAGNOSIS — L719 Rosacea, unspecified: Secondary | ICD-10-CM

## 2018-07-21 DIAGNOSIS — F32A Depression, unspecified: Secondary | ICD-10-CM | POA: Insufficient documentation

## 2018-07-21 DIAGNOSIS — K219 Gastro-esophageal reflux disease without esophagitis: Secondary | ICD-10-CM

## 2018-07-21 MED ORDER — METRONIDAZOLE 0.75 % EX GEL: 1.0000 "application " | Freq: Two times a day (BID) | CUTANEOUS | 3 refills | Status: DC

## 2018-07-21 MED ORDER — CITALOPRAM HYDROBROMIDE 10 MG PO TABS: 10.0000 mg | ORAL_TABLET | Freq: Every day | ORAL | 3 refills | Status: DC

## 2018-07-21 MED ORDER — ALPRAZOLAM 0.25 MG PO TABS: 0.2500 mg | ORAL_TABLET | Freq: Two times a day (BID) | ORAL | 0 refills | Status: DC | PRN

## 2018-07-21 MED ORDER — PANTOPRAZOLE SODIUM 40 MG PO TBEC: 40.0000 mg | DELAYED_RELEASE_TABLET | Freq: Every day | ORAL | 3 refills | Status: DC

## 2018-07-21 NOTE — Assessment & Plan Note (Signed)
Annual physical as above, up-to-date on screening measures. 

## 2018-07-21 NOTE — Assessment & Plan Note (Signed)
Checking labs. She does have a ParaGard in place. Has tried multiple oral contraceptives, all without improvement. Adding a pelvic and transvaginal ultrasound, she will touch base with her gynecologist for further evaluation.

## 2018-07-21 NOTE — Assessment & Plan Note (Signed)
Reddish papular rash over the cheeks, forehead. We will start with topical metronidazole 0.75. If this fails we can switch to topical azelaic acid.

## 2018-07-21 NOTE — Assessment & Plan Note (Signed)
Laryngeal reflux, worse in the mornings. Tells me she is unable to avoid eating within 2 hours of going to bed. Adding pantoprazole at night.

## 2018-07-21 NOTE — Assessment & Plan Note (Addendum)
Filling alprazolam, she understands to use this only as needed. Adding Celexa 10, behavioral therapy. Return in 1 month for a PHQ and GAD.

## 2018-07-21 NOTE — Assessment & Plan Note (Signed)
Declines nutritionist. We did discuss Contrave and Saxenda, she will look into these

## 2018-07-21 NOTE — Progress Notes (Signed)
Subjective:    CC: Annual physicals with multiple issues  HPI:  Coughing: Usually present early in the morning, sour brash, also gets throat clearing and mucus production after eating.  She tends to eat within 2 hours of bedtime.  No melena, hematochezia.  Depression and anxiety: No suicidal or homicidal ideation, increasing use of her alprazolam, multiple stresses including home life, work life.  She is agreeable to use a controller agent and try behavioral therapy.  Skin rash: Sometimes on the back, but today it is mostly on the face, reddish papules over the cheeks, forehead.  Obesity: Would like some help with weight loss.  Declines nutritionist.  Menorrhagia: Bleeding large clots, she does have a ParaGard IUD, she also has a gynecologist.  Moderate dysmenorrhea as well.  She tells me she has tried multiple oral contraceptives without improvement in her cycles.  I reviewed the past medical history, family history, social history, surgical history, and allergies today and no changes were needed.  Please see the problem list section below in epic for further details.  Past Medical History: Past Medical History:  Diagnosis Date  . Genital herpes   . Hyperlipidemia   . Obesity    Past Surgical History: Past Surgical History:  Procedure Laterality Date  . CESAREAN SECTION    . TONSILLECTOMY     Social History: Social History   Socioeconomic History  . Marital status: Married    Spouse name: Not on file  . Number of children: Not on file  . Years of education: Not on file  . Highest education level: Not on file  Occupational History  . Not on file  Social Needs  . Financial resource strain: Not on file  . Food insecurity    Worry: Not on file    Inability: Not on file  . Transportation needs    Medical: Not on file    Non-medical: Not on file  Tobacco Use  . Smoking status: Former Smoker    Quit date: 06/23/1992    Years since quitting: 26.0  . Smokeless tobacco:  Never Used  Substance and Sexual Activity  . Alcohol use: Yes    Comment: rarely about once a year  . Drug use: No  . Sexual activity: Not on file  Lifestyle  . Physical activity    Days per week: Not on file    Minutes per session: Not on file  . Stress: Not on file  Relationships  . Social Herbalist on phone: Not on file    Gets together: Not on file    Attends religious service: Not on file    Active member of club or organization: Not on file    Attends meetings of clubs or organizations: Not on file    Relationship status: Not on file  Other Topics Concern  . Not on file  Social History Narrative  . Not on file   Family History: Family History  Problem Relation Age of Onset  . Diabetes Mother   . Cancer Mother        breast  . Hyperlipidemia Father   . Hypertension Father    Allergies: Allergies  Allergen Reactions  . Naproxen Anaphylaxis    "my throat swells up"  . Naproxen Sodium Anaphylaxis    "my throat swells up."   Medications: See med rec.  Review of Systems: No headache, visual changes, nausea, vomiting, diarrhea, constipation, dizziness, abdominal pain, skin rash, fevers, chills, night sweats, swollen lymph  nodes, weight loss, chest pain, body aches, joint swelling, muscle aches, shortness of breath, mood changes, visual or auditory hallucinations.  Objective:    General: Well Developed, well nourished, and in no acute distress.  Neuro: Alert and oriented x3, extra-ocular muscles intact, sensation grossly intact. Cranial nerves II through XII are intact, motor, sensory, and coordinative functions are all intact. HEENT: Normocephalic, atraumatic, pupils equal round reactive to light, neck supple, no masses, no lymphadenopathy, thyroid nonpalpable. Oropharynx, nasopharynx, external ear canals are unremarkable. Skin: Warm and dry, extremely mild reddish papular rash over the cheeks of her face. Cardiac: Regular rate and rhythm, no murmurs rubs  or gallops.  Respiratory: Clear to auscultation bilaterally. Not using accessory muscles, speaking in full sentences.  Abdominal: Soft, nontender, nondistended, positive bowel sounds, no masses, no organomegaly.  Musculoskeletal: Shoulder, elbow, wrist, hip, knee, ankle stable, and with full range of motion.  Impression and Recommendations:    The patient was counselled, risk factors were discussed, anticipatory guidance given.  Annual physical exam Annual physical as above, up-to-date on screening measures.   Anxiety and depression Filling alprazolam, she understands to use this only as needed. Adding Celexa 10, behavioral therapy. Return in 1 month for a PHQ and GAD.  Menorrhagia Checking labs. She does have a ParaGard in place. Has tried multiple oral contraceptives, all without improvement. Adding a pelvic and transvaginal ultrasound, she will touch base with her gynecologist for further evaluation.  Obesity Declines nutritionist. We did discuss Contrave and Saxenda, she will look into these  LPRD (laryngopharyngeal reflux disease) Laryngeal reflux, worse in the mornings. Tells me she is unable to avoid eating within 2 hours of going to bed. Adding pantoprazole at night.  Rosacea Reddish papular rash over the cheeks, forehead. We will start with topical metronidazole 0.75. If this fails we can switch to topical azelaic acid.   ___________________________________________ Ihor Austinhomas J. Benjamin Stainhekkekandam, M.D., ABFM., CAQSM. Primary Care and Sports Medicine [facility]  Adjunct Professor of Family Medicine  [employer]

## 2018-07-22 LAB — COMPLETE METABOLIC PANEL WITH GFR
AG Ratio: 2 (calc) (ref 1.0–2.5)
ALT: 28 U/L (ref 6–29)
AST: 17 U/L (ref 10–30)
Albumin: 4.7 g/dL (ref 3.6–5.1)
Alkaline phosphatase (APISO): 57 U/L (ref 31–125)
BUN: 11 mg/dL (ref 7–25)
CO2: 26 mmol/L (ref 20–32)
Calcium: 9.7 mg/dL (ref 8.6–10.2)
Chloride: 103 mmol/L (ref 98–110)
Creat: 0.78 mg/dL (ref 0.50–1.10)
GFR, Est African American: 108 mL/min/{1.73_m2} (ref >=60)
GFR, Est Non African American: 93 mL/min/{1.73_m2} (ref >=60)
Globulin: 2.3 g/dL (calc) (ref 1.9–3.7)
Glucose, Bld: 110 mg/dL — ABNORMAL HIGH (ref 65–99)
Potassium: 5.1 mmol/L (ref 3.5–5.3)
Sodium: 138 mmol/L (ref 135–146)
Total Bilirubin: 0.7 mg/dL (ref 0.2–1.2)
Total Protein: 7 g/dL (ref 6.1–8.1)

## 2018-07-22 LAB — TSH: TSH: 0.92 mIU/L

## 2018-07-22 LAB — CBC
HCT: 43.3 % (ref 35.0–45.0)
Hemoglobin: 14.9 g/dL (ref 11.7–15.5)
MCH: 31.2 pg (ref 27.0–33.0)
MCHC: 34.4 g/dL (ref 32.0–36.0)
MCV: 90.6 fL (ref 80.0–100.0)
MPV: 9.1 fL (ref 7.5–12.5)
Platelets: 356 10*3/uL (ref 140–400)
RBC: 4.78 10*6/uL (ref 3.80–5.10)
RDW: 12.3 % (ref 11.0–15.0)
WBC: 4.7 10*3/uL (ref 3.8–10.8)

## 2018-07-22 LAB — HEMOGLOBIN A1C
Hgb A1c MFr Bld: 5.4 % of total Hgb (ref <5.7)
Mean Plasma Glucose: 108 (calc)
eAG (mmol/L): 6 (calc)

## 2018-07-22 LAB — LIPID PANEL W/REFLEX DIRECT LDL
Cholesterol: 217 mg/dL — ABNORMAL HIGH (ref <200)
HDL: 60 mg/dL (ref >=50)
LDL Cholesterol (Calc): 134 mg/dL (calc) — ABNORMAL HIGH
Non-HDL Cholesterol (Calc): 157 mg/dL (calc) — ABNORMAL HIGH (ref <130)
Total CHOL/HDL Ratio: 3.6 (calc) (ref <5.0)
Triglycerides: 115 mg/dL (ref <150)

## 2018-07-22 LAB — VITAMIN D 25 HYDROXY (VIT D DEFICIENCY, FRACTURES): Vit D, 25-Hydroxy: 32 ng/mL (ref 30–100)

## 2018-07-24 ENCOUNTER — Ambulatory Visit (INDEPENDENT_AMBULATORY_CARE_PROVIDER_SITE_OTHER): Payer: BC Managed Care – PPO

## 2018-07-24 ENCOUNTER — Other Ambulatory Visit: Payer: Self-pay

## 2018-07-24 DIAGNOSIS — N920 Excessive and frequent menstruation with regular cycle: Secondary | ICD-10-CM | POA: Diagnosis not present

## 2018-07-24 DIAGNOSIS — N926 Irregular menstruation, unspecified: Secondary | ICD-10-CM | POA: Diagnosis not present

## 2018-08-12 DIAGNOSIS — M25552 Pain in left hip: Secondary | ICD-10-CM | POA: Diagnosis not present

## 2018-08-12 DIAGNOSIS — M9903 Segmental and somatic dysfunction of lumbar region: Secondary | ICD-10-CM | POA: Diagnosis not present

## 2018-08-12 DIAGNOSIS — M9905 Segmental and somatic dysfunction of pelvic region: Secondary | ICD-10-CM | POA: Diagnosis not present

## 2018-08-12 DIAGNOSIS — M5442 Lumbago with sciatica, left side: Secondary | ICD-10-CM | POA: Diagnosis not present

## 2018-08-14 DIAGNOSIS — M5442 Lumbago with sciatica, left side: Secondary | ICD-10-CM | POA: Diagnosis not present

## 2018-08-14 DIAGNOSIS — M9905 Segmental and somatic dysfunction of pelvic region: Secondary | ICD-10-CM | POA: Diagnosis not present

## 2018-08-14 DIAGNOSIS — M9903 Segmental and somatic dysfunction of lumbar region: Secondary | ICD-10-CM | POA: Diagnosis not present

## 2018-08-14 DIAGNOSIS — M25552 Pain in left hip: Secondary | ICD-10-CM | POA: Diagnosis not present

## 2018-08-18 ENCOUNTER — Ambulatory Visit (INDEPENDENT_AMBULATORY_CARE_PROVIDER_SITE_OTHER): Payer: BC Managed Care – PPO | Admitting: Sports Medicine

## 2018-08-18 ENCOUNTER — Encounter: Payer: Self-pay | Admitting: Sports Medicine

## 2018-08-18 DIAGNOSIS — K219 Gastro-esophageal reflux disease without esophagitis: Secondary | ICD-10-CM | POA: Diagnosis not present

## 2018-08-18 DIAGNOSIS — F329 Major depressive disorder, single episode, unspecified: Secondary | ICD-10-CM

## 2018-08-18 DIAGNOSIS — L719 Rosacea, unspecified: Secondary | ICD-10-CM

## 2018-08-18 DIAGNOSIS — F419 Anxiety disorder, unspecified: Secondary | ICD-10-CM

## 2018-08-18 DIAGNOSIS — F32A Depression, unspecified: Secondary | ICD-10-CM

## 2018-08-18 MED ORDER — PANTOPRAZOLE SODIUM 40 MG PO TBEC: 40.0000 mg | DELAYED_RELEASE_TABLET | Freq: Two times a day (BID) | ORAL | 3 refills | Status: DC

## 2018-08-18 MED ORDER — CITALOPRAM HYDROBROMIDE 20 MG PO TABS: 20.0000 mg | ORAL_TABLET | Freq: Every day | ORAL | 3 refills | Status: DC

## 2018-08-18 NOTE — Assessment & Plan Note (Signed)
Still with a hacking cough in the morning. Emphasized the importance of not eating within 2 hours of bedtime. Avoiding spicy foods, caffeine. Increasing pantoprazole to twice daily. If insufficient improvement we will add a long-acting ICS/LABA inhaler at bedtime.

## 2018-08-18 NOTE — Assessment & Plan Note (Signed)
Resolved with topical metronidazole.

## 2018-08-18 NOTE — Progress Notes (Signed)
Subjective:    CC: Follow-up  HPI: Anxiety and depression: Maybe a small improvement with Celexa, she is sleeping a bit better.  She does endorse yawning through the day now.  Laryngeopharyngeal reflux: Still with significant hacking and coughing in the morning, no improvement yet with pantoprazole 40 mg once daily.  She will continue to try to put it for 2 hours between her last meal and laying flat at night.  Rosacea: Resolved with topical metronidazole.  I reviewed the past medical history, family history, social history, surgical history, and allergies today and no changes were needed.  Please see the problem list section below in epic for further details.  Past Medical History: Past Medical History:  Diagnosis Date  . Genital herpes   . Hyperlipidemia   . Obesity    Past Surgical History: Past Surgical History:  Procedure Laterality Date  . CESAREAN SECTION    . TONSILLECTOMY     Social History: Social History   Socioeconomic History  . Marital status: Married    Spouse name: Not on file  . Number of children: Not on file  . Years of education: Not on file  . Highest education level: Not on file  Occupational History  . Not on file  Social Needs  . Financial resource strain: Not on file  . Food insecurity    Worry: Not on file    Inability: Not on file  . Transportation needs    Medical: Not on file    Non-medical: Not on file  Tobacco Use  . Smoking status: Former Smoker    Quit date: 06/23/1992    Years since quitting: 26.1  . Smokeless tobacco: Never Used  Substance and Sexual Activity  . Alcohol use: Yes    Comment: rarely about once a year  . Drug use: No  . Sexual activity: Not on file  Lifestyle  . Physical activity    Days per week: Not on file    Minutes per session: Not on file  . Stress: Not on file  Relationships  . Social Musicianconnections    Talks on phone: Not on file    Gets together: Not on file    Attends religious service: Not on file     Active member of club or organization: Not on file    Attends meetings of clubs or organizations: Not on file    Relationship status: Not on file  Other Topics Concern  . Not on file  Social History Narrative  . Not on file   Family History: Family History  Problem Relation Age of Onset  . Diabetes Mother   . Cancer Mother        breast  . Hyperlipidemia Father   . Hypertension Father    Allergies: Allergies  Allergen Reactions  . Naproxen Anaphylaxis    "my throat swells up"  . Naproxen Sodium Anaphylaxis    "my throat swells up."   Medications: See med rec.  Review of Systems: No fevers, chills, night sweats, weight loss, chest pain, or shortness of breath.   Objective:    General: Well Developed, well nourished, and in no acute distress.  Neuro: Alert and oriented x3, extra-ocular muscles intact, sensation grossly intact.  HEENT: Normocephalic, atraumatic, pupils equal round reactive to light, neck supple, no masses, no lymphadenopathy, thyroid nonpalpable.  Skin: Warm and dry, no rashes. Cardiac: Regular rate and rhythm, no murmurs rubs or gallops, no lower extremity edema.  Respiratory: Clear to auscultation bilaterally. Not  using accessory muscles, speaking in full sentences.   Impression and Recommendations:    Anxiety and depression Possibly slight improvement, sleeping better. Anxiety and depressive symptoms are fairly unchanged, increasing to 20 mg, we can recheck this in 6 weeks. She also tends to yawn a lot, I suspect there may be some underlying sleep apnea but we will hold off on a sleep study for now.  LPRD (laryngopharyngeal reflux disease) Still with a hacking cough in the morning. Emphasized the importance of not eating within 2 hours of bedtime. Avoiding spicy foods, caffeine. Increasing pantoprazole to twice daily. If insufficient improvement we will add a long-acting ICS/LABA inhaler at bedtime.  Rosacea Resolved with topical  metronidazole.   ___________________________________________ Gwen Her. Dianah Field, M.D., ABFM., CAQSM. Primary Care and Sports Medicine [facility]  Adjunct Professor of [employer]

## 2018-08-18 NOTE — Assessment & Plan Note (Addendum)
Possibly slight improvement, sleeping better. Anxiety and depressive symptoms are fairly unchanged, increasing to 20 mg, we can recheck this in 6 weeks. She also tends to yawn a lot, I suspect there may be some underlying sleep apnea but we will hold off on a sleep study for now.

## 2018-08-22 DIAGNOSIS — M9902 Segmental and somatic dysfunction of thoracic region: Secondary | ICD-10-CM | POA: Diagnosis not present

## 2018-08-22 DIAGNOSIS — M25552 Pain in left hip: Secondary | ICD-10-CM | POA: Diagnosis not present

## 2018-08-22 DIAGNOSIS — M9903 Segmental and somatic dysfunction of lumbar region: Secondary | ICD-10-CM | POA: Diagnosis not present

## 2018-08-22 DIAGNOSIS — M546 Pain in thoracic spine: Secondary | ICD-10-CM | POA: Diagnosis not present

## 2018-08-22 DIAGNOSIS — M9904 Segmental and somatic dysfunction of sacral region: Secondary | ICD-10-CM | POA: Diagnosis not present

## 2018-08-22 DIAGNOSIS — M9905 Segmental and somatic dysfunction of pelvic region: Secondary | ICD-10-CM | POA: Diagnosis not present

## 2018-08-22 DIAGNOSIS — M545 Low back pain: Secondary | ICD-10-CM | POA: Diagnosis not present

## 2018-08-22 DIAGNOSIS — M7612 Psoas tendinitis, left hip: Secondary | ICD-10-CM | POA: Diagnosis not present

## 2018-08-22 DIAGNOSIS — M5442 Lumbago with sciatica, left side: Secondary | ICD-10-CM | POA: Diagnosis not present

## 2018-08-28 ENCOUNTER — Telehealth: Payer: Self-pay | Admitting: Sports Medicine

## 2018-08-28 NOTE — Telephone Encounter (Signed)
I had attempted to work on a PA for Pantoprazole BID and insurance did not give me the option. Pharmacy is aware and will call the patient.

## 2018-09-05 DIAGNOSIS — M9906 Segmental and somatic dysfunction of lower extremity: Secondary | ICD-10-CM | POA: Diagnosis not present

## 2018-09-05 DIAGNOSIS — M9904 Segmental and somatic dysfunction of sacral region: Secondary | ICD-10-CM | POA: Diagnosis not present

## 2018-09-05 DIAGNOSIS — M25552 Pain in left hip: Secondary | ICD-10-CM | POA: Diagnosis not present

## 2018-09-05 DIAGNOSIS — M9903 Segmental and somatic dysfunction of lumbar region: Secondary | ICD-10-CM | POA: Diagnosis not present

## 2018-09-05 DIAGNOSIS — M545 Low back pain: Secondary | ICD-10-CM | POA: Diagnosis not present

## 2018-09-05 DIAGNOSIS — M546 Pain in thoracic spine: Secondary | ICD-10-CM | POA: Diagnosis not present

## 2018-09-05 DIAGNOSIS — M9902 Segmental and somatic dysfunction of thoracic region: Secondary | ICD-10-CM | POA: Diagnosis not present

## 2018-09-05 DIAGNOSIS — M7612 Psoas tendinitis, left hip: Secondary | ICD-10-CM | POA: Diagnosis not present

## 2018-09-06 ENCOUNTER — Emergency Department
Admission: EM | Admit: 2018-09-06 | Discharge: 2018-09-06 | Disposition: A | Discharge status: Home / Self Care | Payer: BC Managed Care – PPO | Source: Home / Self Care

## 2018-09-06 ENCOUNTER — Other Ambulatory Visit: Payer: Self-pay

## 2018-09-06 ENCOUNTER — Encounter: Payer: Self-pay | Admitting: Emergency Medicine

## 2018-09-06 DIAGNOSIS — T63441A Toxic effect of venom of bees, accidental (unintentional), initial encounter: Secondary | ICD-10-CM

## 2018-09-06 DIAGNOSIS — W57XXXA Bitten or stung by nonvenomous insect and other nonvenomous arthropods, initial encounter: Secondary | ICD-10-CM

## 2018-09-06 MED ORDER — CEPHALEXIN 500 MG PO CAPS: 500.0000 mg | ORAL_CAPSULE | Freq: Three times a day (TID) | ORAL | 0 refills | Status: DC

## 2018-09-06 MED ORDER — CETIRIZINE HCL 10 MG PO TABS: 10.0000 mg | ORAL_TABLET | Freq: Every day | ORAL | 0 refills | Status: DC

## 2018-09-06 MED ORDER — PREDNISONE 20 MG PO TABS: ORAL_TABLET | ORAL | 0 refills | Status: DC

## 2018-09-06 NOTE — ED Triage Notes (Signed)
Patient was stung by a hornet on her right lower leg x 7 days ago, has been applying Benadryl topical, applying ice, area is still red, warm to touch and noticed bruising down her leg.

## 2018-09-06 NOTE — Discharge Instructions (Signed)
°  Keep area clean with warm water and mild soap.   Please take antibiotics as prescribed and be sure to complete entire course even if you start to feel better to ensure infection does not come back.  You may continue elevating your leg and applying topical anti-itch creams.  Please follow up with family medicine later this week if not improving, especially if symptoms worsening.

## 2018-09-06 NOTE — ED Provider Notes (Signed)
[facility]    CSN: [id number] Arrival date & time: 09/06/18  1447     History   Chief Complaint Chief Complaint  Patient presents with  . Insect Bite    HPI Breanna [name] is a 43 y.o. female.   HPI  Breanna [name] is a 43 y.o. female presenting to UC with c/o 7 days of continued Right calf pain, swelling, redness, warmth and itching after being stung by a hornet.  She has tried oral benadryl at night and topical benadryl and ice during the day with mild relief.  She has also noticed small areas of bruising down her lower leg.  Denies other rashes  On her body. Denies fever, chills, oral swelling, nausea, or SOB.   Past Medical History:  Diagnosis Date  . Genital herpes   . Hyperlipidemia   . Obesity     Patient Active Problem List   Diagnosis Date Noted  . Anxiety and depression 07/21/2018  . Menorrhagia 07/21/2018  . LPRD (laryngopharyngeal reflux disease) 07/21/2018  . Rosacea 07/21/2018  . Internal hemorrhoid, bleeding 10/01/2017  . Hyperlipidemia 01/29/2017  . 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 Goiter 08/09/2014  . Carpal tunnel syndrome, bilateral 04/15/2014  . 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 Chronic idiopathic constipation 09/10/2012  . Cervical radiculitis 06/25/2012  . Cyst of breast, left, benign solitary 05/26/2012  . Obesity 05/26/2012  . Annual physical exam 05/26/2012  . GENITAL HERPES 11/24/2009    Past Surgical History:  Procedure Laterality Date  . CESAREAN SECTION    . TONSILLECTOMY      OB History   No obstetric history on file.      Home Medications    Prior to Admission medications   Medication Sig Start Date End Date Taking? Authorizing Provider  ACYCLOVIR PO Take by mouth.    [provider]  ALPRAZolam Prudy Feeler(XANAX) 0.25 MG tablet Take 1 tablet (0.25 mg total) by mouth 2 (two) times daily as needed for anxiety. 07/21/18   Monica Bectonhekkekandam, Thomas J, MD  cephALEXin (KEFLEX) 500 MG capsule  Take 1 capsule (500 mg total) by mouth 3 (three) times daily. 09/06/18   Lurene ShadowPhelps, Mazen Marcin O, PA-C  cetirizine (ZYRTEC) 10 MG tablet Take 1 tablet (10 mg total) by mouth daily. 09/06/18   Lurene ShadowPhelps, Darroll Bredeson O, PA-C  citalopram (CELEXA) 20 MG tablet Take 1 tablet (20 mg total) by mouth daily. 08/18/18   Monica Bectonhekkekandam, Thomas J, MD  metroNIDAZOLE (METROGEL) 0.75 % gel Apply 1 application topically 2 (two) times daily. 07/21/18   Monica Bectonhekkekandam, Thomas J, MD  predniSONE (DELTASONE) 20 MG tablet 3 tabs po day one, then 2 po daily x 4 days 09/06/18   Lurene ShadowPhelps, Chelise Hanger O, PA-C  Triamcinolone Acetonide (NASACORT ALLERGY 24HR NA) Place into the nose.    [provider]    Family History Family History  Problem Relation Age of Onset  . Diabetes Mother   . Cancer Mother        breast  . Hyperlipidemia Father   . Hypertension Father     Social History Social History   Tobacco Use  . Smoking status: Former Smoker    Quit date: 06/23/1992    Years since quitting: 26.2  . Smokeless tobacco: Never Used  Substance Use Topics  . Alcohol use: Yes    Comment: rarely about once a year  . Drug use: No     Allergies   Naproxen and Naproxen sodium   Review of Systems Review of  Systems  Constitutional: Negative for chills and fever.  HENT: Negative for facial swelling.   Musculoskeletal: Negative for arthralgias and myalgias.  Skin: Positive for color change and wound.     Physical Exam Triage Vital Signs ED Triage Vitals [09/06/18 1521]  Enc Vitals Group     BP 119/85     Pulse Rate 84     Resp      Temp 98.5 F (36.9 C)     Temp Source Oral     SpO2 99 %     Weight 210 lb (95.3 kg)     Height 5\' 4"  (1.626 m)     Head Circumference      Peak Flow      Pain Score 2     Pain Loc      Pain Edu?      Excl. in GC?    No data found.  Updated Vital Signs BP 119/85 (BP Location: Left Arm)   Pulse 84   Temp 98.5 F (36.9 C) (Oral)   Ht 5\' 4"  (1.626 m)   Wt 210 lb (95.3 kg)   LMP 08/29/2018  (Exact Date)   SpO2 99%   BMI 36.05 kg/m   Visual Acuity Right Eye Distance:   Left Eye Distance:   Bilateral Distance:    Right Eye Near:   Left Eye Near:    Bilateral Near:     Physical Exam Vitals signs and nursing note reviewed.  Constitutional:      Appearance: Normal appearance. She is well-developed.  HENT:     Head: Normocephalic and atraumatic.  Neck:     Musculoskeletal: Normal range of motion.  Cardiovascular:     Rate and Rhythm: Normal rate.  Pulmonary:     Effort: Pulmonary effort is normal.  Musculoskeletal: Normal range of motion.        General: Swelling and tenderness present.     Comments: Right calf: mild swelling and tenderness, muscle compartment is soft. Full ROM knee and ankle.   Skin:    General: Skin is warm and dry.     Findings: Bruising and erythema present.       Neurological:     Mental Status: She is alert and oriented to person, place, and time.  Psychiatric:        Behavior: Behavior normal.      UC Treatments / Results  Labs (all labs ordered are listed, but only abnormal results are displayed) Labs Reviewed - No data to display  EKG   Radiology No results found.  Procedures Procedures (including critical care time)  Medications Ordered in UC Medications - No data to display  Initial Impression / Assessment and Plan / UC Course  I have reviewed the triage vital signs and the nursing notes.  Pertinent labs & imaging results that were available during my care of the patient were reviewed by me and considered in my medical decision making (see chart for details).    Will covered for persistent local allergic reaction as well as mild cellulitis AVS provided  Final Clinical Impressions(s) / UC Diagnoses   Final diagnoses:  Bug bite with infection, initial encounter  Bee sting, accidental or unintentional, initial encounter     Discharge Instructions      Keep area clean with warm water and mild soap.    Please take antibiotics as prescribed and be sure to complete entire course even if you start to feel better to ensure infection does not come  back.  You may continue elevating your leg and applying topical anti-itch creams.  Please follow up with family medicine later this week if not improving, especially if symptoms worsening.     ED Prescriptions    Medication Sig Dispense Auth. Provider   cephALEXin (KEFLEX) 500 MG capsule Take 1 capsule (500 mg total) by mouth 3 (three) times daily. 21 capsule Gerarda Fraction, Daffney Greenly O, PA-C   predniSONE (DELTASONE) 20 MG tablet 3 tabs po day one, then 2 po daily x 4 days 11 tablet Vyolet Sakuma O, PA-C   cetirizine (ZYRTEC) 10 MG tablet Take 1 tablet (10 mg total) by mouth daily. 30 tablet Noe Gens, PA-C     Controlled Substance Prescriptions [place] Controlled Substance Registry consulted? Not Applicable   Noe Gens, PA-C 09/06/18 1600

## 2018-09-08 ENCOUNTER — Telehealth: Payer: Self-pay

## 2018-09-08 NOTE — Telephone Encounter (Signed)
Spoke with patient.  Doing about the same.  Will follow up as needed.

## 2018-09-15 ENCOUNTER — Ambulatory Visit (INDEPENDENT_AMBULATORY_CARE_PROVIDER_SITE_OTHER): Payer: BC Managed Care – PPO | Admitting: Psychology

## 2018-09-15 DIAGNOSIS — F419 Anxiety disorder, unspecified: Secondary | ICD-10-CM

## 2018-09-19 DIAGNOSIS — M7612 Psoas tendinitis, left hip: Secondary | ICD-10-CM | POA: Diagnosis not present

## 2018-09-19 DIAGNOSIS — M9906 Segmental and somatic dysfunction of lower extremity: Secondary | ICD-10-CM | POA: Diagnosis not present

## 2018-09-19 DIAGNOSIS — M9903 Segmental and somatic dysfunction of lumbar region: Secondary | ICD-10-CM | POA: Diagnosis not present

## 2018-09-19 DIAGNOSIS — M9904 Segmental and somatic dysfunction of sacral region: Secondary | ICD-10-CM | POA: Diagnosis not present

## 2018-09-19 DIAGNOSIS — M9905 Segmental and somatic dysfunction of pelvic region: Secondary | ICD-10-CM | POA: Diagnosis not present

## 2018-09-19 DIAGNOSIS — M25552 Pain in left hip: Secondary | ICD-10-CM | POA: Diagnosis not present

## 2018-09-19 DIAGNOSIS — M9902 Segmental and somatic dysfunction of thoracic region: Secondary | ICD-10-CM | POA: Diagnosis not present

## 2018-09-19 DIAGNOSIS — M545 Low back pain: Secondary | ICD-10-CM | POA: Diagnosis not present

## 2018-09-23 ENCOUNTER — Encounter: Payer: Self-pay | Admitting: Sports Medicine

## 2018-09-29 ENCOUNTER — Encounter: Payer: Self-pay | Admitting: Sports Medicine

## 2018-09-29 ENCOUNTER — Other Ambulatory Visit: Payer: Self-pay

## 2018-09-29 ENCOUNTER — Ambulatory Visit (INDEPENDENT_AMBULATORY_CARE_PROVIDER_SITE_OTHER): Payer: BC Managed Care – PPO | Admitting: Sports Medicine

## 2018-09-29 VITALS — BP 140/76 | HR 88 | Ht 64.0 in | Wt 211.0 lb

## 2018-09-29 DIAGNOSIS — R197 Diarrhea, unspecified: Secondary | ICD-10-CM

## 2018-09-29 DIAGNOSIS — F419 Anxiety disorder, unspecified: Secondary | ICD-10-CM | POA: Diagnosis not present

## 2018-09-29 DIAGNOSIS — F329 Major depressive disorder, single episode, unspecified: Secondary | ICD-10-CM | POA: Diagnosis not present

## 2018-09-29 DIAGNOSIS — K219 Gastro-esophageal reflux disease without esophagitis: Secondary | ICD-10-CM

## 2018-09-29 DIAGNOSIS — F32A Depression, unspecified: Secondary | ICD-10-CM

## 2018-09-29 MED ORDER — CITALOPRAM HYDROBROMIDE 20 MG PO TABS: 20.0000 mg | ORAL_TABLET | Freq: Every day | ORAL | 3 refills | Status: DC

## 2018-09-29 NOTE — Assessment & Plan Note (Signed)
Feels pretty even keeled on 20 of Celexa, calling in 90-day supplies. Has only used a single alprazolam in the last month.

## 2018-09-29 NOTE — Assessment & Plan Note (Signed)
No improvement with Protonix, she was unable to get it twice a day. At this point we are going to discontinue, she did develop some loose stools, because of the possibility of community-acquired C. difficile we are going to add C. difficile testing as well. She has noted that Nasacort has been the most effective thing. We did discuss possibly using an inhaler but I think I would like to scratch this idea for now. We are going to just observe for now.  She feels as though she can live with it.

## 2018-09-29 NOTE — Progress Notes (Signed)
Subjective:    CC: Follow-up  HPI: Mood disorder: Much better on Celexa 20, has only used a single alprazolam in the last month, feels more even keeled.  LPRD: Has not noticed any improvement with pantoprazole, it seems as though Nasacort is provided the best improvement.  Unfortunately she has developed some increasing loose stools.  No blood, occasional tenesmus.  Moderate, persistent.  Present for the last month.  I reviewed the past medical history, family history, social history, surgical history, and allergies today and no changes were needed.  Please see the problem list section below in epic for further details.  Past Medical History: Past Medical History:  Diagnosis Date  . Genital herpes   . Hyperlipidemia   . Obesity    Past Surgical History: Past Surgical History:  Procedure Laterality Date  . CESAREAN SECTION    . TONSILLECTOMY     Social History: Social History   Socioeconomic History  . Marital status: Married    Spouse name: Not on file  . Number of children: Not on file  . Years of education: Not on file  . Highest education level: Not on file  Occupational History  . Not on file  Social Needs  . Financial resource strain: Not on file  . Food insecurity    Worry: Not on file    Inability: Not on file  . Transportation needs    Medical: Not on file    Non-medical: Not on file  Tobacco Use  . Smoking status: Former Smoker    Quit date: 06/23/1992    Years since quitting: 26.2  . Smokeless tobacco: Never Used  Substance and Sexual Activity  . Alcohol use: Yes    Comment: rarely about once a year  . Drug use: No  . Sexual activity: Not on file  Lifestyle  . Physical activity    Days per week: Not on file    Minutes per session: Not on file  . Stress: Not on file  Relationships  . Social Herbalist on phone: Not on file    Gets together: Not on file    Attends religious service: Not on file    Active member of club or organization:  Not on file    Attends meetings of clubs or organizations: Not on file    Relationship status: Not on file  Other Topics Concern  . Not on file  Social History Narrative  . Not on file   Family History: Family History  Problem Relation Age of Onset  . Diabetes Mother   . Cancer Mother        breast  . Hyperlipidemia Father   . Hypertension Father    Allergies: Allergies  Allergen Reactions  . Naproxen Anaphylaxis    "my throat swells up"  . Naproxen Sodium Anaphylaxis    "my throat swells up."   Medications: See med rec.  Review of Systems: No fevers, chills, night sweats, weight loss, chest pain, or shortness of breath.   Objective:    General: Well Developed, well nourished, and in no acute distress.  Neuro: Alert and oriented x3, extra-ocular muscles intact, sensation grossly intact.  HEENT: Normocephalic, atraumatic, pupils equal round reactive to light, neck supple, no masses, no lymphadenopathy, thyroid nonpalpable.  Skin: Warm and dry, no rashes. Cardiac: Regular rate and rhythm, no murmurs rubs or gallops, no lower extremity edema.  Respiratory: Clear to auscultation bilaterally. Not using accessory muscles, speaking in full sentences.  Impression  and Recommendations:    Anxiety and depression Feels pretty even keeled on 20 of Celexa, calling in 90-day supplies. Has only used a single alprazolam in the last month.  LPRD (laryngopharyngeal reflux disease) No improvement with Protonix, she was unable to get it twice a day. At this point we are going to discontinue, she did develop some loose stools, because of the possibility of community-acquired C. difficile we are going to add C. difficile testing as well. She has noted that Nasacort has been the most effective thing. We did discuss possibly using an inhaler but I think I would like to scratch this idea for now. We are going to just observe for now.  She feels as though she can live with it.    ___________________________________________ Ihor Austinhomas J. Benjamin Stainhekkekandam, M.D., ABFM., CAQSM. Primary Care and Sports Medicine [facility]  Adjunct Professor of Family Medicine  [employer]

## 2018-10-01 ENCOUNTER — Ambulatory Visit (INDEPENDENT_AMBULATORY_CARE_PROVIDER_SITE_OTHER): Payer: BC Managed Care – PPO | Admitting: Psychology

## 2018-10-01 DIAGNOSIS — R197 Diarrhea, unspecified: Secondary | ICD-10-CM | POA: Diagnosis not present

## 2018-10-01 DIAGNOSIS — F419 Anxiety disorder, unspecified: Secondary | ICD-10-CM | POA: Diagnosis not present

## 2018-10-02 LAB — C. DIFFICILE GDH AND TOXIN A/B
GDH ANTIGEN: NOT DETECTED
MICRO NUMBER:: 845672
SPECIMEN QUALITY:: ADEQUATE
TOXIN A AND B: NOT DETECTED

## 2018-10-03 DIAGNOSIS — M545 Low back pain: Secondary | ICD-10-CM | POA: Diagnosis not present

## 2018-10-03 DIAGNOSIS — M9906 Segmental and somatic dysfunction of lower extremity: Secondary | ICD-10-CM | POA: Diagnosis not present

## 2018-10-03 DIAGNOSIS — M7612 Psoas tendinitis, left hip: Secondary | ICD-10-CM | POA: Diagnosis not present

## 2018-10-03 DIAGNOSIS — M25552 Pain in left hip: Secondary | ICD-10-CM | POA: Diagnosis not present

## 2018-10-03 DIAGNOSIS — M9903 Segmental and somatic dysfunction of lumbar region: Secondary | ICD-10-CM | POA: Diagnosis not present

## 2018-10-03 DIAGNOSIS — M9905 Segmental and somatic dysfunction of pelvic region: Secondary | ICD-10-CM | POA: Diagnosis not present

## 2018-10-03 DIAGNOSIS — M9904 Segmental and somatic dysfunction of sacral region: Secondary | ICD-10-CM | POA: Diagnosis not present

## 2018-10-03 DIAGNOSIS — M9902 Segmental and somatic dysfunction of thoracic region: Secondary | ICD-10-CM | POA: Diagnosis not present

## 2018-10-13 ENCOUNTER — Ambulatory Visit (INDEPENDENT_AMBULATORY_CARE_PROVIDER_SITE_OTHER): Payer: BC Managed Care – PPO | Admitting: Psychology

## 2018-10-13 DIAGNOSIS — F419 Anxiety disorder, unspecified: Secondary | ICD-10-CM | POA: Diagnosis not present

## 2018-10-27 ENCOUNTER — Ambulatory Visit (INDEPENDENT_AMBULATORY_CARE_PROVIDER_SITE_OTHER): Payer: BC Managed Care – PPO | Admitting: Psychology

## 2018-10-27 DIAGNOSIS — F419 Anxiety disorder, unspecified: Secondary | ICD-10-CM

## 2018-11-13 ENCOUNTER — Ambulatory Visit (INDEPENDENT_AMBULATORY_CARE_PROVIDER_SITE_OTHER): Payer: BC Managed Care – PPO | Admitting: Psychology

## 2018-11-13 DIAGNOSIS — F419 Anxiety disorder, unspecified: Secondary | ICD-10-CM

## 2018-12-01 ENCOUNTER — Ambulatory Visit: Payer: BC Managed Care – PPO | Admitting: Psychology

## 2018-12-15 ENCOUNTER — Ambulatory Visit: Payer: BC Managed Care – PPO | Admitting: Psychology

## 2018-12-16 ENCOUNTER — Ambulatory Visit (INDEPENDENT_AMBULATORY_CARE_PROVIDER_SITE_OTHER): Payer: BC Managed Care – PPO | Admitting: Psychology

## 2018-12-16 DIAGNOSIS — F419 Anxiety disorder, unspecified: Secondary | ICD-10-CM | POA: Diagnosis not present

## 2018-12-29 ENCOUNTER — Ambulatory Visit (INDEPENDENT_AMBULATORY_CARE_PROVIDER_SITE_OTHER): Payer: BC Managed Care – PPO | Admitting: Psychology

## 2018-12-29 DIAGNOSIS — F419 Anxiety disorder, unspecified: Secondary | ICD-10-CM

## 2019-01-12 ENCOUNTER — Ambulatory Visit (INDEPENDENT_AMBULATORY_CARE_PROVIDER_SITE_OTHER): Payer: BC Managed Care – PPO | Admitting: Psychology

## 2019-01-12 DIAGNOSIS — F419 Anxiety disorder, unspecified: Secondary | ICD-10-CM

## 2019-01-14 DIAGNOSIS — Z01419 Encounter for gynecological examination (general) (routine) without abnormal findings: Secondary | ICD-10-CM | POA: Diagnosis not present

## 2019-01-14 DIAGNOSIS — Z1231 Encounter for screening mammogram for malignant neoplasm of breast: Secondary | ICD-10-CM | POA: Diagnosis not present

## 2019-01-26 ENCOUNTER — Ambulatory Visit: Payer: BC Managed Care – PPO | Admitting: Psychology

## 2019-02-05 DIAGNOSIS — Z20822 Contact with and (suspected) exposure to covid-19: Secondary | ICD-10-CM | POA: Diagnosis not present

## 2019-02-09 ENCOUNTER — Ambulatory Visit (INDEPENDENT_AMBULATORY_CARE_PROVIDER_SITE_OTHER): Payer: BC Managed Care – PPO | Admitting: Psychology

## 2019-02-09 DIAGNOSIS — F419 Anxiety disorder, unspecified: Secondary | ICD-10-CM

## 2019-02-23 ENCOUNTER — Ambulatory Visit (INDEPENDENT_AMBULATORY_CARE_PROVIDER_SITE_OTHER): Payer: BC Managed Care – PPO | Admitting: Psychology

## 2019-02-23 DIAGNOSIS — F419 Anxiety disorder, unspecified: Secondary | ICD-10-CM

## 2019-03-09 ENCOUNTER — Ambulatory Visit (INDEPENDENT_AMBULATORY_CARE_PROVIDER_SITE_OTHER): Payer: BC Managed Care – PPO | Admitting: Psychology

## 2019-03-09 DIAGNOSIS — F419 Anxiety disorder, unspecified: Secondary | ICD-10-CM

## 2019-03-30 ENCOUNTER — Encounter: Payer: Self-pay | Admitting: Sports Medicine

## 2019-03-30 ENCOUNTER — Ambulatory Visit (INDEPENDENT_AMBULATORY_CARE_PROVIDER_SITE_OTHER): Payer: BC Managed Care – PPO | Admitting: Sports Medicine

## 2019-03-30 ENCOUNTER — Other Ambulatory Visit: Payer: Self-pay

## 2019-03-30 DIAGNOSIS — Z0000 Encounter for general adult medical examination without abnormal findings: Secondary | ICD-10-CM | POA: Diagnosis not present

## 2019-03-30 DIAGNOSIS — H269 Unspecified cataract: Secondary | ICD-10-CM | POA: Diagnosis not present

## 2019-03-30 DIAGNOSIS — E785 Hyperlipidemia, unspecified: Secondary | ICD-10-CM | POA: Diagnosis not present

## 2019-03-30 NOTE — Progress Notes (Addendum)
Subjective:    CC: Annual Physical Exam  HPI:  This patient is here for their annual physical  I reviewed the past medical history, family history, social history, surgical history, and allergies today and no changes were needed.  Please see the problem list section below in epic for further details.  Past Medical History: Past Medical History:  Diagnosis Date  . Genital herpes   . Hyperlipidemia   . Obesity    Past Surgical History: Past Surgical History:  Procedure Laterality Date  . CESAREAN SECTION    . TONSILLECTOMY     Social History: Social History   Socioeconomic History  . Marital status: Married    Spouse name: Not on file  . Number of children: Not on file  . Years of education: Not on file  . Highest education level: Not on file  Occupational History  . Not on file  Tobacco Use  . Smoking status: Former Smoker    Quit date: 06/23/1992    Years since quitting: 26.7  . Smokeless tobacco: Never Used  Substance and Sexual Activity  . Alcohol use: Yes    Comment: rarely about once a year  . Drug use: No  . Sexual activity: Not on file  Other Topics Concern  . Not on file  Social History Narrative  . Not on file   Social Determinants of Health   Financial Resource Strain:   . Difficulty of Paying Living Expenses: Not on file  Food Insecurity:   . Worried About Charity fundraiser in the Last Year: Not on file  . Ran Out of Food in the Last Year: Not on file  Transportation Needs:   . Lack of Transportation (Medical): Not on file  . Lack of Transportation (Non-Medical): Not on file  Physical Activity:   . Days of Exercise per Week: Not on file  . Minutes of Exercise per Session: Not on file  Stress:   . Feeling of Stress : Not on file  Social Connections:   . Frequency of Communication with Friends and Family: Not on file  . Frequency of Social Gatherings with Friends and Family: Not on file  . Attends Religious Services: Not on file  . Active  Member of Clubs or Organizations: Not on file  . Attends Archivist Meetings: Not on file  . Marital Status: Not on file   Family History: Family History  Problem Relation Age of Onset  . Diabetes Mother   . Cancer Mother        breast  . Hyperlipidemia Father   . Hypertension Father    Allergies: Allergies  Allergen Reactions  . Naproxen Anaphylaxis    "my throat swells up"  . Naproxen Sodium Anaphylaxis    "my throat swells up."   Medications: See med rec.  Review of Systems: No headache, visual changes, nausea, vomiting, diarrhea, constipation, dizziness, abdominal pain, skin rash, fevers, chills, night sweats, swollen lymph nodes, weight loss, chest pain, body aches, joint swelling, muscle aches, shortness of breath, mood changes, visual or auditory hallucinations.  Objective:    General: Well Developed, well nourished, and in no acute distress.  Neuro: Alert and oriented x3, extra-ocular muscles intact, sensation grossly intact. Cranial nerves II through XII are intact, motor, sensory, and coordinative functions are all intact. HEENT: Normocephalic, atraumatic, pupils equal round reactive to light, neck supple, no masses, no lymphadenopathy, thyroid nonpalpable. Oropharynx, nasopharynx, external ear canals are unremarkable. Skin: Warm and dry, no rashes noted.  Cardiac: Regular rate and rhythm, no murmurs rubs or gallops.  Respiratory: Clear to auscultation bilaterally. Not using accessory muscles, speaking in full sentences.  Abdominal: Soft, nontender, nondistended, positive bowel sounds, no masses, no organomegaly.  Musculoskeletal: Shoulder, elbow, wrist, hip, knee, ankle stable, and with full range of motion.  Impression and Recommendations:    The patient was counselled, risk factors were discussed, anticipatory guidance given.  Annual physical exam Routine physical as above, adding routine labs.  Cataract Bilateral left worse than right cataracts, she  does have an ophthalmologist following. Adding hemoglobin A1c.  Hyperlipidemia Labs look good except for lipids which are moderately elevated, they are high enough to consider medication unless she feels that she has room for a low-cholesterol diet.   ___________________________________________ Ihor Austin. Benjamin Stain, M.D., ABFM., CAQSM. Primary Care and Sports Medicine [facility]  Adjunct Professor of Family Medicine  [employer]

## 2019-03-30 NOTE — Assessment & Plan Note (Signed)
Routine physical as above, adding routine labs.

## 2019-03-30 NOTE — Assessment & Plan Note (Signed)
Bilateral left worse than right cataracts, she does have an ophthalmologist following. Adding hemoglobin A1c.

## 2019-03-31 LAB — COMPLETE METABOLIC PANEL WITH GFR
AG Ratio: 1.7 (calc) (ref 1.0–2.5)
ALT: 22 U/L (ref 6–29)
AST: 13 U/L (ref 10–30)
Albumin: 4.5 g/dL (ref 3.6–5.1)
Alkaline phosphatase (APISO): 56 U/L (ref 31–125)
BUN: 14 mg/dL (ref 7–25)
CO2: 26 mmol/L (ref 20–32)
Calcium: 9.7 mg/dL (ref 8.6–10.2)
Chloride: 105 mmol/L (ref 98–110)
Creat: 0.83 mg/dL (ref 0.50–1.10)
GFR, Est African American: 99 mL/min/{1.73_m2} (ref >=60)
GFR, Est Non African American: 86 mL/min/{1.73_m2} (ref >=60)
Globulin: 2.6 g/dL (calc) (ref 1.9–3.7)
Glucose, Bld: 117 mg/dL — ABNORMAL HIGH (ref 65–99)
Potassium: 4.5 mmol/L (ref 3.5–5.3)
Sodium: 138 mmol/L (ref 135–146)
Total Bilirubin: 0.5 mg/dL (ref 0.2–1.2)
Total Protein: 7.1 g/dL (ref 6.1–8.1)

## 2019-03-31 LAB — HEMOGLOBIN A1C
Hgb A1c MFr Bld: 5.6 % of total Hgb (ref <5.7)
Mean Plasma Glucose: 114 (calc)
eAG (mmol/L): 6.3 (calc)

## 2019-03-31 LAB — CBC
HCT: 43.4 % (ref 35.0–45.0)
Hemoglobin: 14.9 g/dL (ref 11.7–15.5)
MCH: 30.4 pg (ref 27.0–33.0)
MCHC: 34.3 g/dL (ref 32.0–36.0)
MCV: 88.6 fL (ref 80.0–100.0)
MPV: 9.2 fL (ref 7.5–12.5)
Platelets: 333 10*3/uL (ref 140–400)
RBC: 4.9 10*6/uL (ref 3.80–5.10)
RDW: 12.6 % (ref 11.0–15.0)
WBC: 4.9 10*3/uL (ref 3.8–10.8)

## 2019-03-31 LAB — LIPID PANEL W/REFLEX DIRECT LDL
Cholesterol: 240 mg/dL — ABNORMAL HIGH (ref <200)
HDL: 63 mg/dL (ref >=50)
LDL Cholesterol (Calc): 155 mg/dL (calc) — ABNORMAL HIGH
Non-HDL Cholesterol (Calc): 177 mg/dL (calc) — ABNORMAL HIGH (ref <130)
Total CHOL/HDL Ratio: 3.8 (calc) (ref <5.0)
Triglycerides: 104 mg/dL (ref <150)

## 2019-03-31 LAB — TSH: TSH: 1.04 mIU/L

## 2019-03-31 NOTE — Assessment & Plan Note (Signed)
Labs look good except for lipids which are moderately elevated, they are high enough to consider medication unless she feels that she has room for a low-cholesterol diet.

## 2019-04-06 ENCOUNTER — Ambulatory Visit (INDEPENDENT_AMBULATORY_CARE_PROVIDER_SITE_OTHER): Payer: BC Managed Care – PPO | Admitting: Psychology

## 2019-04-06 DIAGNOSIS — F419 Anxiety disorder, unspecified: Secondary | ICD-10-CM | POA: Diagnosis not present

## 2019-04-21 DIAGNOSIS — L820 Inflamed seborrheic keratosis: Secondary | ICD-10-CM | POA: Diagnosis not present

## 2019-04-21 DIAGNOSIS — L578 Other skin changes due to chronic exposure to nonionizing radiation: Secondary | ICD-10-CM | POA: Diagnosis not present

## 2019-04-30 DIAGNOSIS — R635 Abnormal weight gain: Secondary | ICD-10-CM | POA: Diagnosis not present

## 2019-04-30 DIAGNOSIS — N951 Menopausal and female climacteric states: Secondary | ICD-10-CM | POA: Diagnosis not present

## 2019-05-04 ENCOUNTER — Ambulatory Visit (INDEPENDENT_AMBULATORY_CARE_PROVIDER_SITE_OTHER): Payer: BC Managed Care – PPO | Admitting: Psychology

## 2019-05-04 DIAGNOSIS — F419 Anxiety disorder, unspecified: Secondary | ICD-10-CM

## 2019-05-27 DIAGNOSIS — N390 Urinary tract infection, site not specified: Secondary | ICD-10-CM | POA: Diagnosis not present

## 2019-05-27 DIAGNOSIS — R350 Frequency of micturition: Secondary | ICD-10-CM | POA: Diagnosis not present

## 2019-05-27 DIAGNOSIS — Z20822 Contact with and (suspected) exposure to covid-19: Secondary | ICD-10-CM | POA: Diagnosis not present

## 2019-05-31 DIAGNOSIS — R6882 Decreased libido: Secondary | ICD-10-CM | POA: Diagnosis not present

## 2019-05-31 DIAGNOSIS — E782 Mixed hyperlipidemia: Secondary | ICD-10-CM | POA: Diagnosis not present

## 2019-05-31 DIAGNOSIS — H25813 Combined forms of age-related cataract, bilateral: Secondary | ICD-10-CM | POA: Diagnosis not present

## 2019-05-31 DIAGNOSIS — H04123 Dry eye syndrome of bilateral lacrimal glands: Secondary | ICD-10-CM | POA: Diagnosis not present

## 2019-05-31 DIAGNOSIS — R7301 Impaired fasting glucose: Secondary | ICD-10-CM | POA: Diagnosis not present

## 2019-05-31 DIAGNOSIS — Z1331 Encounter for screening for depression: Secondary | ICD-10-CM | POA: Diagnosis not present

## 2019-05-31 DIAGNOSIS — H527 Unspecified disorder of refraction: Secondary | ICD-10-CM | POA: Diagnosis not present

## 2019-05-31 DIAGNOSIS — Z1339 Encounter for screening examination for other mental health and behavioral disorders: Secondary | ICD-10-CM | POA: Diagnosis not present

## 2019-05-31 DIAGNOSIS — F5101 Primary insomnia: Secondary | ICD-10-CM | POA: Diagnosis not present

## 2019-05-31 DIAGNOSIS — H52203 Unspecified astigmatism, bilateral: Secondary | ICD-10-CM | POA: Diagnosis not present

## 2019-06-01 ENCOUNTER — Ambulatory Visit: Payer: BC Managed Care – PPO | Admitting: Psychology

## 2019-06-03 DIAGNOSIS — U071 COVID-19: Secondary | ICD-10-CM | POA: Diagnosis not present

## 2019-06-03 DIAGNOSIS — Z9189 Other specified personal risk factors, not elsewhere classified: Secondary | ICD-10-CM | POA: Diagnosis not present

## 2019-06-07 DIAGNOSIS — U071 COVID-19: Secondary | ICD-10-CM | POA: Diagnosis not present

## 2019-06-07 DIAGNOSIS — Z9189 Other specified personal risk factors, not elsewhere classified: Secondary | ICD-10-CM | POA: Diagnosis not present

## 2019-06-15 ENCOUNTER — Other Ambulatory Visit: Payer: Self-pay

## 2019-06-15 ENCOUNTER — Other Ambulatory Visit: Payer: Self-pay | Admitting: Sports Medicine

## 2019-06-15 DIAGNOSIS — F32A Depression, unspecified: Secondary | ICD-10-CM

## 2019-06-15 DIAGNOSIS — F419 Anxiety disorder, unspecified: Secondary | ICD-10-CM

## 2019-06-29 ENCOUNTER — Ambulatory Visit (INDEPENDENT_AMBULATORY_CARE_PROVIDER_SITE_OTHER): Payer: BC Managed Care – PPO | Admitting: Psychology

## 2019-06-29 DIAGNOSIS — F419 Anxiety disorder, unspecified: Secondary | ICD-10-CM

## 2019-07-22 DIAGNOSIS — Z6837 Body mass index (BMI) 37.0-37.9, adult: Secondary | ICD-10-CM | POA: Diagnosis not present

## 2019-07-22 DIAGNOSIS — E782 Mixed hyperlipidemia: Secondary | ICD-10-CM | POA: Diagnosis not present

## 2019-08-04 ENCOUNTER — Ambulatory Visit: Payer: BC Managed Care – PPO | Admitting: Psychology

## 2019-08-06 DIAGNOSIS — R7301 Impaired fasting glucose: Secondary | ICD-10-CM | POA: Diagnosis not present

## 2019-08-06 DIAGNOSIS — Z6836 Body mass index (BMI) 36.0-36.9, adult: Secondary | ICD-10-CM | POA: Diagnosis not present

## 2019-08-13 DIAGNOSIS — Z6836 Body mass index (BMI) 36.0-36.9, adult: Secondary | ICD-10-CM | POA: Diagnosis not present

## 2019-08-13 DIAGNOSIS — E782 Mixed hyperlipidemia: Secondary | ICD-10-CM | POA: Diagnosis not present

## 2019-08-13 DIAGNOSIS — R7301 Impaired fasting glucose: Secondary | ICD-10-CM | POA: Diagnosis not present

## 2019-08-20 DIAGNOSIS — E782 Mixed hyperlipidemia: Secondary | ICD-10-CM | POA: Diagnosis not present

## 2019-08-20 DIAGNOSIS — Z6836 Body mass index (BMI) 36.0-36.9, adult: Secondary | ICD-10-CM | POA: Diagnosis not present

## 2019-09-03 DIAGNOSIS — Z6835 Body mass index (BMI) 35.0-35.9, adult: Secondary | ICD-10-CM | POA: Diagnosis not present

## 2019-09-03 DIAGNOSIS — E782 Mixed hyperlipidemia: Secondary | ICD-10-CM | POA: Diagnosis not present

## 2019-09-10 DIAGNOSIS — E782 Mixed hyperlipidemia: Secondary | ICD-10-CM | POA: Diagnosis not present

## 2019-09-10 DIAGNOSIS — Z6835 Body mass index (BMI) 35.0-35.9, adult: Secondary | ICD-10-CM | POA: Diagnosis not present

## 2019-09-17 DIAGNOSIS — R7301 Impaired fasting glucose: Secondary | ICD-10-CM | POA: Diagnosis not present

## 2019-09-17 DIAGNOSIS — E782 Mixed hyperlipidemia: Secondary | ICD-10-CM | POA: Diagnosis not present

## 2019-09-17 DIAGNOSIS — Z6835 Body mass index (BMI) 35.0-35.9, adult: Secondary | ICD-10-CM | POA: Diagnosis not present

## 2019-09-24 DIAGNOSIS — Z6835 Body mass index (BMI) 35.0-35.9, adult: Secondary | ICD-10-CM | POA: Diagnosis not present

## 2019-09-24 DIAGNOSIS — E7800 Pure hypercholesterolemia, unspecified: Secondary | ICD-10-CM | POA: Diagnosis not present

## 2019-09-30 DIAGNOSIS — E782 Mixed hyperlipidemia: Secondary | ICD-10-CM | POA: Diagnosis not present

## 2019-09-30 DIAGNOSIS — Z6835 Body mass index (BMI) 35.0-35.9, adult: Secondary | ICD-10-CM | POA: Diagnosis not present

## 2019-10-05 ENCOUNTER — Other Ambulatory Visit: Payer: Self-pay | Admitting: Sports Medicine

## 2019-10-05 DIAGNOSIS — F419 Anxiety disorder, unspecified: Secondary | ICD-10-CM

## 2019-10-07 DIAGNOSIS — H25812 Combined forms of age-related cataract, left eye: Secondary | ICD-10-CM | POA: Diagnosis not present

## 2019-10-07 DIAGNOSIS — H25813 Combined forms of age-related cataract, bilateral: Secondary | ICD-10-CM | POA: Diagnosis not present

## 2019-10-07 DIAGNOSIS — Z886 Allergy status to analgesic agent status: Secondary | ICD-10-CM | POA: Diagnosis not present

## 2019-10-07 DIAGNOSIS — F329 Major depressive disorder, single episode, unspecified: Secondary | ICD-10-CM | POA: Diagnosis not present

## 2019-10-07 DIAGNOSIS — Z79899 Other long term (current) drug therapy: Secondary | ICD-10-CM | POA: Diagnosis not present

## 2019-10-07 DIAGNOSIS — H52222 Regular astigmatism, left eye: Secondary | ICD-10-CM | POA: Diagnosis not present

## 2019-10-08 DIAGNOSIS — Z961 Presence of intraocular lens: Secondary | ICD-10-CM | POA: Diagnosis not present

## 2019-10-29 DIAGNOSIS — H25811 Combined forms of age-related cataract, right eye: Secondary | ICD-10-CM | POA: Diagnosis not present

## 2019-10-29 DIAGNOSIS — Z961 Presence of intraocular lens: Secondary | ICD-10-CM | POA: Diagnosis not present

## 2019-11-11 DIAGNOSIS — H25811 Combined forms of age-related cataract, right eye: Secondary | ICD-10-CM | POA: Diagnosis not present

## 2019-11-11 DIAGNOSIS — Z9842 Cataract extraction status, left eye: Secondary | ICD-10-CM | POA: Diagnosis not present

## 2019-11-11 DIAGNOSIS — Z961 Presence of intraocular lens: Secondary | ICD-10-CM | POA: Diagnosis not present

## 2019-11-11 DIAGNOSIS — F329 Major depressive disorder, single episode, unspecified: Secondary | ICD-10-CM | POA: Diagnosis not present

## 2019-11-11 DIAGNOSIS — Z886 Allergy status to analgesic agent status: Secondary | ICD-10-CM | POA: Diagnosis not present

## 2019-11-11 DIAGNOSIS — Z79899 Other long term (current) drug therapy: Secondary | ICD-10-CM | POA: Diagnosis not present

## 2019-11-11 DIAGNOSIS — Z87891 Personal history of nicotine dependence: Secondary | ICD-10-CM | POA: Diagnosis not present

## 2019-11-12 DIAGNOSIS — H527 Unspecified disorder of refraction: Secondary | ICD-10-CM | POA: Diagnosis not present

## 2019-11-12 DIAGNOSIS — Z961 Presence of intraocular lens: Secondary | ICD-10-CM | POA: Diagnosis not present

## 2019-11-12 DIAGNOSIS — H04123 Dry eye syndrome of bilateral lacrimal glands: Secondary | ICD-10-CM | POA: Diagnosis not present

## 2019-11-22 DIAGNOSIS — Z961 Presence of intraocular lens: Secondary | ICD-10-CM | POA: Diagnosis not present

## 2019-11-22 DIAGNOSIS — H04123 Dry eye syndrome of bilateral lacrimal glands: Secondary | ICD-10-CM | POA: Diagnosis not present

## 2019-11-22 DIAGNOSIS — H538 Other visual disturbances: Secondary | ICD-10-CM | POA: Diagnosis not present

## 2019-11-22 DIAGNOSIS — H527 Unspecified disorder of refraction: Secondary | ICD-10-CM | POA: Diagnosis not present

## 2020-01-03 DIAGNOSIS — H26493 Other secondary cataract, bilateral: Secondary | ICD-10-CM | POA: Diagnosis not present

## 2020-01-03 DIAGNOSIS — Z961 Presence of intraocular lens: Secondary | ICD-10-CM | POA: Diagnosis not present

## 2020-01-03 DIAGNOSIS — H43811 Vitreous degeneration, right eye: Secondary | ICD-10-CM | POA: Diagnosis not present

## 2020-01-03 DIAGNOSIS — H04123 Dry eye syndrome of bilateral lacrimal glands: Secondary | ICD-10-CM | POA: Diagnosis not present

## 2020-02-10 DIAGNOSIS — E041 Nontoxic single thyroid nodule: Secondary | ICD-10-CM | POA: Diagnosis not present

## 2020-02-10 DIAGNOSIS — Z13220 Encounter for screening for lipoid disorders: Secondary | ICD-10-CM | POA: Diagnosis not present

## 2020-02-10 DIAGNOSIS — R635 Abnormal weight gain: Secondary | ICD-10-CM | POA: Diagnosis not present

## 2020-02-10 DIAGNOSIS — E538 Deficiency of other specified B group vitamins: Secondary | ICD-10-CM | POA: Diagnosis not present

## 2020-02-10 DIAGNOSIS — E559 Vitamin D deficiency, unspecified: Secondary | ICD-10-CM | POA: Diagnosis not present

## 2020-02-10 DIAGNOSIS — Z789 Other specified health status: Secondary | ICD-10-CM | POA: Diagnosis not present

## 2020-02-10 DIAGNOSIS — R5383 Other fatigue: Secondary | ICD-10-CM | POA: Diagnosis not present

## 2020-02-10 DIAGNOSIS — R519 Headache, unspecified: Secondary | ICD-10-CM | POA: Diagnosis not present

## 2020-02-11 DIAGNOSIS — F4321 Adjustment disorder with depressed mood: Secondary | ICD-10-CM | POA: Diagnosis not present

## 2020-02-17 ENCOUNTER — Encounter: Payer: Self-pay | Admitting: Osteopathic Medicine

## 2020-02-17 ENCOUNTER — Ambulatory Visit: Payer: BC Managed Care – PPO | Admitting: Osteopathic Medicine

## 2020-02-17 ENCOUNTER — Other Ambulatory Visit: Payer: Self-pay

## 2020-02-17 VITALS — BP 140/94 | HR 119 | Temp 97.5°F | Wt 215.4 lb

## 2020-02-17 DIAGNOSIS — F419 Anxiety disorder, unspecified: Secondary | ICD-10-CM | POA: Diagnosis not present

## 2020-02-17 DIAGNOSIS — Z113 Encounter for screening for infections with a predominantly sexual mode of transmission: Secondary | ICD-10-CM | POA: Diagnosis not present

## 2020-02-17 DIAGNOSIS — F32A Depression, unspecified: Secondary | ICD-10-CM

## 2020-02-17 MED ORDER — ALPRAZOLAM 0.25 MG PO TABS: 0.2500 mg | ORAL_TABLET | Freq: Two times a day (BID) | ORAL | 0 refills | Status: DC | PRN

## 2020-02-17 MED ORDER — ACYCLOVIR 400 MG PO TABS: 400.0000 mg | ORAL_TABLET | Freq: Three times a day (TID) | ORAL | 0 refills | Status: DC

## 2020-02-17 NOTE — Progress Notes (Signed)
Breanna [name] is a 45 y.o. female who presents to  [facility] at [facility]  today, 02/17/20, seeking care for the following:  . STD screening - no known exposure, came home to empty house the other day, husband was gone... she is safe and has support.      ASSESSMENT & PLAN with other pertinent findings:  The primary encounter diagnosis was Routine screening for STI (sexually transmitted infection). A diagnosis of Anxiety and depression was also pertinent to this visit.   Known HSV, acyclovir refilled Anxiety given situation understandable - xanax refilled  Pt advised may see results before I do on MyChart - I WILL CALL if anything serious!   No results found for this or any previous visit (from the past 24 hour(s)).   There are no Patient Instructions on file for this visit.  Orders Placed This Encounter  Procedures  . Chlamydia/Gonococcus/Trichomonas, NAA  . Hepatitis C antibody, reflex  . Hepatitis B core antibody, total  . Hepatitis B surface antigen  . HIV Antibody (routine testing w rflx)  . RPR  . Hepatitis C Antibody    Meds ordered this encounter  Medications  . ALPRAZolam (XANAX) 0.25 MG tablet    Sig: Take 1 tablet (0.25 mg total) by mouth 2 (two) times daily as needed for anxiety.    Dispense:  20 tablet    Refill:  0    This request is for a new prescription for a controlled substance as required by Federal/State law. DX Code Needed  .  Marland Kitchen acyclovir (ZOVIRAX) 400 MG tablet    Sig: Take 1 tablet (400 mg total) by mouth 3 (three) times daily. As needed for herpes outbreak (take for at least 5 days, can continue to 10 days total or can stop sooner if resolution of symptoms)    Dispense:  45 tablet    Refill:  0       Follow-up instructions: Return for RECHECK PENDING RESULTS / IF WORSE OR CHANGE.                                         BP (!) 140/94 (BP Location: Left  Arm, Patient Position: Sitting, Cuff Size: Normal)   Pulse (!) 119   Temp (!) 97.5 F (36.4 C)   Wt 215 lb 6.4 oz (97.7 kg)   SpO2 92%   BMI 36.97 kg/m  GYN: No lesions/ulcers to external genitalia, normal urethra, normal vaginal mucosa, physiologic discharge, cervix normal without lesions, uterus not enlarged or tender, adnexa no masses and nontender. Some menstrual blood present. IUD strings visible.    Current Meds  Medication Sig  . acyclovir (ZOVIRAX) 400 MG tablet Take 1 tablet (400 mg total) by mouth 3 (three) times daily. As needed for herpes outbreak (take for at least 5 days, can continue to 10 days total or can stop sooner if resolution of symptoms)  . citalopram (CELEXA) 20 MG tablet TAKE 1 TABLET BY MOUTH EVERY DAY  . paragard intrauterine copper IUD IUD by Intrauterine route.  . Triamcinolone Acetonide (NASACORT ALLERGY 24HR NA) Place into the nose.  . [DISCONTINUED] ACYCLOVIR PO Take by mouth.  . [DISCONTINUED] ALPRAZolam (XANAX) 0.25 MG tablet TAKE 1 TABLET (0.25 MG TOTAL) BY MOUTH 2 (TWO) TIMES DAILY AS NEEDED FOR ANXIETY.    No results found for this or any previous visit (  from the past 72 hour(s)).  No results found.     All questions at time of visit were answered - patient instructed to contact office with any additional concerns or updates.  ER/RTC precautions were reviewed with the patient as applicable.   Please note: voice recognition software was used to produce this document, and typos may escape review. Please contact Dr. Lyn Hollingshead for any needed clarifications.

## 2020-02-19 LAB — HEPATITIS B CORE ANTIBODY, TOTAL: Hep B Core Total Ab: NEGATIVE

## 2020-02-19 LAB — HEPATITIS C ANTIBODY: Hep C Virus Ab: 0.9 s/co ratio (ref 0.0–0.9)

## 2020-02-19 LAB — CHLAMYDIA/GONOCOCCUS/TRICHOMONAS, NAA
Chlamydia by NAA: NEGATIVE
Gonococcus by NAA: NEGATIVE
Trich vag by NAA: NEGATIVE

## 2020-02-19 LAB — SPECIMEN STATUS REPORT

## 2020-02-19 LAB — RPR: RPR Ser Ql: NONREACTIVE

## 2020-02-19 LAB — HEPATITIS B SURFACE ANTIGEN: Hepatitis B Surface Ag: NEGATIVE

## 2020-02-19 LAB — HIV ANTIBODY (ROUTINE TESTING W REFLEX): HIV Screen 4th Generation wRfx: NONREACTIVE

## 2020-02-21 DIAGNOSIS — F4321 Adjustment disorder with depressed mood: Secondary | ICD-10-CM | POA: Diagnosis not present

## 2020-02-25 DIAGNOSIS — F4321 Adjustment disorder with depressed mood: Secondary | ICD-10-CM | POA: Diagnosis not present

## 2020-02-27 ENCOUNTER — Other Ambulatory Visit: Payer: Self-pay | Admitting: Osteopathic Medicine

## 2020-03-01 DIAGNOSIS — F4321 Adjustment disorder with depressed mood: Secondary | ICD-10-CM | POA: Diagnosis not present

## 2020-03-06 DIAGNOSIS — Z01419 Encounter for gynecological examination (general) (routine) without abnormal findings: Secondary | ICD-10-CM | POA: Diagnosis not present

## 2020-03-06 DIAGNOSIS — Z1231 Encounter for screening mammogram for malignant neoplasm of breast: Secondary | ICD-10-CM | POA: Diagnosis not present

## 2020-03-06 DIAGNOSIS — Z124 Encounter for screening for malignant neoplasm of cervix: Secondary | ICD-10-CM | POA: Diagnosis not present

## 2020-03-08 DIAGNOSIS — F4321 Adjustment disorder with depressed mood: Secondary | ICD-10-CM | POA: Diagnosis not present

## 2020-03-16 DIAGNOSIS — E612 Magnesium deficiency: Secondary | ICD-10-CM | POA: Diagnosis not present

## 2020-03-16 DIAGNOSIS — E559 Vitamin D deficiency, unspecified: Secondary | ICD-10-CM | POA: Diagnosis not present

## 2020-03-16 DIAGNOSIS — E041 Nontoxic single thyroid nodule: Secondary | ICD-10-CM | POA: Diagnosis not present

## 2020-03-16 DIAGNOSIS — R5383 Other fatigue: Secondary | ICD-10-CM | POA: Diagnosis not present

## 2020-03-29 DIAGNOSIS — F4321 Adjustment disorder with depressed mood: Secondary | ICD-10-CM | POA: Diagnosis not present

## 2020-04-05 DIAGNOSIS — F4321 Adjustment disorder with depressed mood: Secondary | ICD-10-CM | POA: Diagnosis not present

## 2020-04-12 DIAGNOSIS — F4321 Adjustment disorder with depressed mood: Secondary | ICD-10-CM | POA: Diagnosis not present

## 2020-04-19 DIAGNOSIS — F4321 Adjustment disorder with depressed mood: Secondary | ICD-10-CM | POA: Diagnosis not present

## 2020-04-26 DIAGNOSIS — F4321 Adjustment disorder with depressed mood: Secondary | ICD-10-CM | POA: Diagnosis not present

## 2020-05-03 DIAGNOSIS — F4321 Adjustment disorder with depressed mood: Secondary | ICD-10-CM | POA: Diagnosis not present

## 2020-05-17 DIAGNOSIS — F4321 Adjustment disorder with depressed mood: Secondary | ICD-10-CM | POA: Diagnosis not present

## 2020-05-24 DIAGNOSIS — F4321 Adjustment disorder with depressed mood: Secondary | ICD-10-CM | POA: Diagnosis not present

## 2020-06-02 DIAGNOSIS — E538 Deficiency of other specified B group vitamins: Secondary | ICD-10-CM | POA: Diagnosis not present

## 2020-06-02 DIAGNOSIS — R5383 Other fatigue: Secondary | ICD-10-CM | POA: Diagnosis not present

## 2020-06-02 DIAGNOSIS — E041 Nontoxic single thyroid nodule: Secondary | ICD-10-CM | POA: Diagnosis not present

## 2020-06-02 DIAGNOSIS — Z131 Encounter for screening for diabetes mellitus: Secondary | ICD-10-CM | POA: Diagnosis not present

## 2020-06-02 DIAGNOSIS — E559 Vitamin D deficiency, unspecified: Secondary | ICD-10-CM | POA: Diagnosis not present

## 2020-06-07 DIAGNOSIS — F4321 Adjustment disorder with depressed mood: Secondary | ICD-10-CM | POA: Diagnosis not present

## 2020-06-14 DIAGNOSIS — F4321 Adjustment disorder with depressed mood: Secondary | ICD-10-CM | POA: Diagnosis not present

## 2020-06-16 DIAGNOSIS — R5383 Other fatigue: Secondary | ICD-10-CM | POA: Diagnosis not present

## 2020-06-16 DIAGNOSIS — E041 Nontoxic single thyroid nodule: Secondary | ICD-10-CM | POA: Diagnosis not present

## 2020-06-16 DIAGNOSIS — E559 Vitamin D deficiency, unspecified: Secondary | ICD-10-CM | POA: Diagnosis not present

## 2020-06-16 DIAGNOSIS — E612 Magnesium deficiency: Secondary | ICD-10-CM | POA: Diagnosis not present

## 2020-06-21 DIAGNOSIS — F4321 Adjustment disorder with depressed mood: Secondary | ICD-10-CM | POA: Diagnosis not present

## 2020-07-05 DIAGNOSIS — F4321 Adjustment disorder with depressed mood: Secondary | ICD-10-CM | POA: Diagnosis not present

## 2020-07-11 DIAGNOSIS — F4321 Adjustment disorder with depressed mood: Secondary | ICD-10-CM | POA: Diagnosis not present

## 2020-07-18 DIAGNOSIS — F4321 Adjustment disorder with depressed mood: Secondary | ICD-10-CM | POA: Diagnosis not present

## 2020-07-25 DIAGNOSIS — F4321 Adjustment disorder with depressed mood: Secondary | ICD-10-CM | POA: Diagnosis not present

## 2020-08-01 DIAGNOSIS — F4321 Adjustment disorder with depressed mood: Secondary | ICD-10-CM | POA: Diagnosis not present

## 2020-08-10 DIAGNOSIS — Z1211 Encounter for screening for malignant neoplasm of colon: Secondary | ICD-10-CM | POA: Diagnosis not present

## 2020-08-10 DIAGNOSIS — R198 Other specified symptoms and signs involving the digestive system and abdomen: Secondary | ICD-10-CM | POA: Diagnosis not present

## 2020-08-10 DIAGNOSIS — Z635 Disruption of family by separation and divorce: Secondary | ICD-10-CM | POA: Diagnosis not present

## 2020-08-11 DIAGNOSIS — F4321 Adjustment disorder with depressed mood: Secondary | ICD-10-CM | POA: Diagnosis not present

## 2020-08-16 DIAGNOSIS — F4321 Adjustment disorder with depressed mood: Secondary | ICD-10-CM | POA: Diagnosis not present

## 2020-08-23 DIAGNOSIS — F4321 Adjustment disorder with depressed mood: Secondary | ICD-10-CM | POA: Diagnosis not present

## 2020-08-30 DIAGNOSIS — F4321 Adjustment disorder with depressed mood: Secondary | ICD-10-CM | POA: Diagnosis not present

## 2020-09-01 DIAGNOSIS — R5383 Other fatigue: Secondary | ICD-10-CM | POA: Diagnosis not present

## 2020-09-01 DIAGNOSIS — R519 Headache, unspecified: Secondary | ICD-10-CM | POA: Diagnosis not present

## 2020-09-01 DIAGNOSIS — E041 Nontoxic single thyroid nodule: Secondary | ICD-10-CM | POA: Diagnosis not present

## 2020-09-01 DIAGNOSIS — Z131 Encounter for screening for diabetes mellitus: Secondary | ICD-10-CM | POA: Diagnosis not present

## 2020-09-01 DIAGNOSIS — E559 Vitamin D deficiency, unspecified: Secondary | ICD-10-CM | POA: Diagnosis not present

## 2020-09-01 DIAGNOSIS — E538 Deficiency of other specified B group vitamins: Secondary | ICD-10-CM | POA: Diagnosis not present

## 2020-09-13 DIAGNOSIS — F4321 Adjustment disorder with depressed mood: Secondary | ICD-10-CM | POA: Diagnosis not present

## 2020-09-15 DIAGNOSIS — R198 Other specified symptoms and signs involving the digestive system and abdomen: Secondary | ICD-10-CM | POA: Diagnosis not present

## 2020-09-15 DIAGNOSIS — E041 Nontoxic single thyroid nodule: Secondary | ICD-10-CM | POA: Diagnosis not present

## 2020-09-15 DIAGNOSIS — E612 Magnesium deficiency: Secondary | ICD-10-CM | POA: Diagnosis not present

## 2020-09-15 DIAGNOSIS — R5383 Other fatigue: Secondary | ICD-10-CM | POA: Diagnosis not present

## 2020-09-20 DIAGNOSIS — F4321 Adjustment disorder with depressed mood: Secondary | ICD-10-CM | POA: Diagnosis not present

## 2020-10-04 DIAGNOSIS — K2980 Duodenitis without bleeding: Secondary | ICD-10-CM | POA: Diagnosis not present

## 2020-10-04 DIAGNOSIS — K2100 Gastro-esophageal reflux disease with esophagitis, without bleeding: Secondary | ICD-10-CM | POA: Diagnosis not present

## 2020-10-04 DIAGNOSIS — Z1211 Encounter for screening for malignant neoplasm of colon: Secondary | ICD-10-CM | POA: Diagnosis not present

## 2020-10-04 DIAGNOSIS — K6389 Other specified diseases of intestine: Secondary | ICD-10-CM | POA: Diagnosis not present

## 2020-10-04 DIAGNOSIS — R195 Other fecal abnormalities: Secondary | ICD-10-CM | POA: Diagnosis not present

## 2020-10-04 DIAGNOSIS — K635 Polyp of colon: Secondary | ICD-10-CM | POA: Diagnosis not present

## 2020-10-04 DIAGNOSIS — R0989 Other specified symptoms and signs involving the circulatory and respiratory systems: Secondary | ICD-10-CM | POA: Diagnosis not present

## 2020-10-18 DIAGNOSIS — F4321 Adjustment disorder with depressed mood: Secondary | ICD-10-CM | POA: Diagnosis not present

## 2020-10-25 DIAGNOSIS — F4321 Adjustment disorder with depressed mood: Secondary | ICD-10-CM | POA: Diagnosis not present

## 2020-11-01 DIAGNOSIS — F4321 Adjustment disorder with depressed mood: Secondary | ICD-10-CM | POA: Diagnosis not present

## 2020-11-15 DIAGNOSIS — F4321 Adjustment disorder with depressed mood: Secondary | ICD-10-CM | POA: Diagnosis not present

## 2020-11-22 DIAGNOSIS — F4321 Adjustment disorder with depressed mood: Secondary | ICD-10-CM | POA: Diagnosis not present

## 2020-11-29 DIAGNOSIS — F4321 Adjustment disorder with depressed mood: Secondary | ICD-10-CM | POA: Diagnosis not present

## 2020-12-13 DIAGNOSIS — F4321 Adjustment disorder with depressed mood: Secondary | ICD-10-CM | POA: Diagnosis not present

## 2020-12-19 ENCOUNTER — Other Ambulatory Visit: Payer: Self-pay | Admitting: Sports Medicine

## 2020-12-19 DIAGNOSIS — F419 Anxiety disorder, unspecified: Secondary | ICD-10-CM

## 2020-12-19 DIAGNOSIS — F32A Depression, unspecified: Secondary | ICD-10-CM

## 2020-12-27 DIAGNOSIS — F4321 Adjustment disorder with depressed mood: Secondary | ICD-10-CM | POA: Diagnosis not present

## 2021-01-03 DIAGNOSIS — F4321 Adjustment disorder with depressed mood: Secondary | ICD-10-CM | POA: Diagnosis not present

## 2021-01-11 DIAGNOSIS — R0989 Other specified symptoms and signs involving the circulatory and respiratory systems: Secondary | ICD-10-CM | POA: Diagnosis not present

## 2021-01-11 DIAGNOSIS — K5904 Chronic idiopathic constipation: Secondary | ICD-10-CM | POA: Diagnosis not present

## 2021-01-16 ENCOUNTER — Other Ambulatory Visit: Payer: Self-pay | Admitting: Sports Medicine

## 2021-01-16 DIAGNOSIS — F32A Depression, unspecified: Secondary | ICD-10-CM

## 2021-01-30 ENCOUNTER — Other Ambulatory Visit: Payer: Self-pay | Admitting: Sports Medicine

## 2021-01-30 DIAGNOSIS — F32A Depression, unspecified: Secondary | ICD-10-CM

## 2021-01-30 DIAGNOSIS — F419 Anxiety disorder, unspecified: Secondary | ICD-10-CM

## 2021-01-31 DIAGNOSIS — F4321 Adjustment disorder with depressed mood: Secondary | ICD-10-CM | POA: Diagnosis not present

## 2021-02-14 DIAGNOSIS — F4321 Adjustment disorder with depressed mood: Secondary | ICD-10-CM | POA: Diagnosis not present

## 2021-02-15 ENCOUNTER — Encounter: Payer: Self-pay | Admitting: Sports Medicine

## 2021-02-15 ENCOUNTER — Other Ambulatory Visit: Payer: Self-pay

## 2021-02-15 ENCOUNTER — Ambulatory Visit (INDEPENDENT_AMBULATORY_CARE_PROVIDER_SITE_OTHER): Payer: BC Managed Care – PPO | Admitting: Sports Medicine

## 2021-02-15 VITALS — BP 128/83 | HR 85 | Ht 64.0 in | Wt 234.0 lb

## 2021-02-15 DIAGNOSIS — F32A Depression, unspecified: Secondary | ICD-10-CM | POA: Diagnosis not present

## 2021-02-15 DIAGNOSIS — E785 Hyperlipidemia, unspecified: Secondary | ICD-10-CM

## 2021-02-15 DIAGNOSIS — F419 Anxiety disorder, unspecified: Secondary | ICD-10-CM | POA: Diagnosis not present

## 2021-02-15 DIAGNOSIS — Z23 Encounter for immunization: Secondary | ICD-10-CM | POA: Diagnosis not present

## 2021-02-15 DIAGNOSIS — Z0000 Encounter for general adult medical examination without abnormal findings: Secondary | ICD-10-CM | POA: Diagnosis not present

## 2021-02-15 MED ORDER — CITALOPRAM HYDROBROMIDE 20 MG PO TABS: 20.0000 mg | ORAL_TABLET | Freq: Every day | ORAL | 3 refills | Status: AC

## 2021-02-15 MED ORDER — ACYCLOVIR 400 MG PO TABS
ORAL_TABLET | ORAL | 3 refills | Status: DC
Start: 2021-02-15 — End: 2021-05-15

## 2021-02-15 MED ORDER — ALPRAZOLAM 0.25 MG PO TABS: 0.2500 mg | ORAL_TABLET | Freq: Two times a day (BID) | ORAL | 3 refills | Status: AC | PRN

## 2021-02-15 NOTE — Assessment & Plan Note (Signed)
Annual physical as above, checking routine labs, Tdap today. She does have appointment scheduled for mammogram, cervical cancer screening. She had her colonoscopy last year.

## 2021-02-15 NOTE — Progress Notes (Signed)
Subjective:    CC: Annual Physical Exam  HPI:  This patient is here for their annual physical  I reviewed the past medical history, family history, social history, surgical history, and allergies today and no changes were needed.  Please see the problem list section below in epic for further details.  Past Medical History: Past Medical History:  Diagnosis Date   Genital herpes    Hyperlipidemia    Obesity    Past Surgical History: Past Surgical History:  Procedure Laterality Date   CESAREAN SECTION     TONSILLECTOMY     Social History: Social History   Socioeconomic History   Marital status: Legally Separated    Spouse name: Not on file   Number of children: Not on file   Years of education: Not on file   Highest education level: Not on file  Occupational History   Not on file  Tobacco Use   Smoking status: Former    Types: Cigarettes    Quit date: 06/23/1992    Years since quitting: 28.6   Smokeless tobacco: Never  Vaping Use   Vaping Use: Never used  Substance and Sexual Activity   Alcohol use: Yes    Comment: rarely about once a year   Drug use: No   Sexual activity: Not on file  Other Topics Concern   Not on file  Social History Narrative   Not on file   Social Determinants of Health   Financial Resource Strain: Not on file  Food Insecurity: Not on file  Transportation Needs: Not on file  Physical Activity: Not on file  Stress: Not on file  Social Connections: Not on file   Family History: Family History  Problem Relation Age of Onset   Diabetes Mother    Cancer Mother        breast   Hyperlipidemia Father    Hypertension Father    Allergies: Allergies  Allergen Reactions   Naproxen Anaphylaxis    "my throat swells up"   Naproxen Sodium Anaphylaxis    "my throat swells up."   Nitrofurantoin Hives and Itching   Medications: See med rec.  Review of Systems: No headache, visual changes, nausea, vomiting, diarrhea, constipation,  dizziness, abdominal pain, skin rash, fevers, chills, night sweats, swollen lymph nodes, weight loss, chest pain, body aches, joint swelling, muscle aches, shortness of breath, mood changes, visual or auditory hallucinations.  Objective:    General: Well Developed, well nourished, and in no acute distress.  Neuro: Alert and oriented x3, extra-ocular muscles intact, sensation grossly intact. Cranial nerves II through XII are intact, motor, sensory, and coordinative functions are all intact. HEENT: Normocephalic, atraumatic, pupils equal round reactive to light, neck supple, no masses, no lymphadenopathy, thyroid nonpalpable. Oropharynx, nasopharynx, external ear canals are unremarkable. Skin: Warm and dry, no rashes noted.  Cardiac: Regular rate and rhythm, no murmurs rubs or gallops.  Respiratory: Clear to auscultation bilaterally. Not using accessory muscles, speaking in full sentences.  Abdominal: Soft, nontender, nondistended, positive bowel sounds, no masses, no organomegaly.  Musculoskeletal: Shoulder, elbow, wrist, hip, knee, ankle stable, and with full range of motion.  Impression and Recommendations:    The patient was counselled, risk factors were discussed, anticipatory guidance given.  Annual physical exam Annual physical as above, checking routine labs, Tdap today. She does have appointment scheduled for mammogram, cervical cancer screening. She had her colonoscopy last year.  Anxiety and depression Going through separation/divorce, overall doing well with her therapist, we will continue  her Celexa and alprazolam for now.   ___________________________________________ Ihor Austin. Benjamin Stain, M.D., ABFM., CAQSM. Primary Care and Sports Medicine [place] [facility]  Adjunct Professor of Family Medicine  [employer]

## 2021-02-15 NOTE — Addendum Note (Signed)
Addended by: Gaynelle Arabian on: 02/15/2021 09:22 AM   Modules accepted: Orders

## 2021-02-15 NOTE — Assessment & Plan Note (Signed)
Going through separation/divorce, overall doing well with her therapist, we will continue her Celexa and alprazolam for now.

## 2021-02-16 LAB — LIPID PANEL
Cholesterol: 225 mg/dL — ABNORMAL HIGH (ref <200)
HDL: 71 mg/dL (ref >=50)
LDL Cholesterol (Calc): 133 mg/dL (calc) — ABNORMAL HIGH
Non-HDL Cholesterol (Calc): 154 mg/dL (calc) — ABNORMAL HIGH (ref <130)
Total CHOL/HDL Ratio: 3.2 (calc) (ref <5.0)
Triglycerides: 100 mg/dL (ref <150)

## 2021-02-16 LAB — CBC
HCT: 42.9 % (ref 35.0–45.0)
Hemoglobin: 14.8 g/dL (ref 11.7–15.5)
MCH: 30.6 pg (ref 27.0–33.0)
MCHC: 34.5 g/dL (ref 32.0–36.0)
MCV: 88.8 fL (ref 80.0–100.0)
MPV: 9.2 fL (ref 7.5–12.5)
Platelets: 360 10*3/uL (ref 140–400)
RBC: 4.83 10*6/uL (ref 3.80–5.10)
RDW: 12.8 % (ref 11.0–15.0)
WBC: 4.8 10*3/uL (ref 3.8–10.8)

## 2021-02-16 LAB — COMPREHENSIVE METABOLIC PANEL
AG Ratio: 1.7 (calc) (ref 1.0–2.5)
ALT: 34 U/L — ABNORMAL HIGH (ref 6–29)
AST: 17 U/L (ref 10–35)
Albumin: 4.5 g/dL (ref 3.6–5.1)
Alkaline phosphatase (APISO): 61 U/L (ref 31–125)
BUN: 13 mg/dL (ref 7–25)
CO2: 28 mmol/L (ref 20–32)
Calcium: 9.7 mg/dL (ref 8.6–10.2)
Chloride: 105 mmol/L (ref 98–110)
Creat: 0.88 mg/dL (ref 0.50–0.99)
Globulin: 2.7 g/dL (calc) (ref 1.9–3.7)
Glucose, Bld: 119 mg/dL — ABNORMAL HIGH (ref 65–99)
Potassium: 4.6 mmol/L (ref 3.5–5.3)
Sodium: 138 mmol/L (ref 135–146)
Total Bilirubin: 0.4 mg/dL (ref 0.2–1.2)
Total Protein: 7.2 g/dL (ref 6.1–8.1)

## 2021-02-16 LAB — TSH: TSH: 1.1 mIU/L

## 2021-02-22 ENCOUNTER — Encounter: Payer: Self-pay | Admitting: Sports Medicine

## 2021-02-28 DIAGNOSIS — F4321 Adjustment disorder with depressed mood: Secondary | ICD-10-CM | POA: Diagnosis not present

## 2021-03-14 DIAGNOSIS — F4321 Adjustment disorder with depressed mood: Secondary | ICD-10-CM | POA: Diagnosis not present

## 2021-03-28 DIAGNOSIS — F4321 Adjustment disorder with depressed mood: Secondary | ICD-10-CM | POA: Diagnosis not present

## 2021-04-11 DIAGNOSIS — F4321 Adjustment disorder with depressed mood: Secondary | ICD-10-CM | POA: Diagnosis not present

## 2021-04-12 DIAGNOSIS — Z1231 Encounter for screening mammogram for malignant neoplasm of breast: Secondary | ICD-10-CM | POA: Diagnosis not present

## 2021-04-12 DIAGNOSIS — Z124 Encounter for screening for malignant neoplasm of cervix: Secondary | ICD-10-CM | POA: Diagnosis not present

## 2021-04-12 DIAGNOSIS — Z01419 Encounter for gynecological examination (general) (routine) without abnormal findings: Secondary | ICD-10-CM | POA: Diagnosis not present

## 2021-04-25 DIAGNOSIS — F4321 Adjustment disorder with depressed mood: Secondary | ICD-10-CM | POA: Diagnosis not present

## 2021-05-09 DIAGNOSIS — R928 Other abnormal and inconclusive findings on diagnostic imaging of breast: Secondary | ICD-10-CM | POA: Diagnosis not present

## 2021-05-09 DIAGNOSIS — R922 Inconclusive mammogram: Secondary | ICD-10-CM | POA: Diagnosis not present

## 2021-05-15 ENCOUNTER — Other Ambulatory Visit: Payer: Self-pay | Admitting: Sports Medicine

## 2021-05-23 DIAGNOSIS — F4321 Adjustment disorder with depressed mood: Secondary | ICD-10-CM | POA: Diagnosis not present

## 2021-05-30 ENCOUNTER — Other Ambulatory Visit: Payer: Self-pay | Admitting: Sports Medicine

## 2021-06-06 DIAGNOSIS — F4321 Adjustment disorder with depressed mood: Secondary | ICD-10-CM | POA: Diagnosis not present

## 2021-06-20 DIAGNOSIS — F4321 Adjustment disorder with depressed mood: Secondary | ICD-10-CM | POA: Diagnosis not present

## 2021-07-04 DIAGNOSIS — F4321 Adjustment disorder with depressed mood: Secondary | ICD-10-CM | POA: Diagnosis not present

## 2021-07-18 DIAGNOSIS — F4321 Adjustment disorder with depressed mood: Secondary | ICD-10-CM | POA: Diagnosis not present

## 2021-08-01 DIAGNOSIS — F4321 Adjustment disorder with depressed mood: Secondary | ICD-10-CM | POA: Diagnosis not present

## 2021-08-06 DIAGNOSIS — Z30433 Encounter for removal and reinsertion of intrauterine contraceptive device: Secondary | ICD-10-CM | POA: Diagnosis not present

## 2021-08-29 DIAGNOSIS — F4321 Adjustment disorder with depressed mood: Secondary | ICD-10-CM | POA: Diagnosis not present

## 2021-10-17 DIAGNOSIS — F4321 Adjustment disorder with depressed mood: Secondary | ICD-10-CM | POA: Diagnosis not present

## 2021-11-14 DIAGNOSIS — F4321 Adjustment disorder with depressed mood: Secondary | ICD-10-CM | POA: Diagnosis not present

## 2021-12-19 DIAGNOSIS — F4321 Adjustment disorder with depressed mood: Secondary | ICD-10-CM | POA: Diagnosis not present

## 2022-01-15 ENCOUNTER — Telehealth: Payer: Self-pay | Admitting: Urgent Care

## 2022-01-15 DIAGNOSIS — N3000 Acute cystitis without hematuria: Secondary | ICD-10-CM

## 2022-01-15 MED ORDER — CEPHALEXIN 500 MG PO CAPS: 500.0000 mg | ORAL_CAPSULE | Freq: Two times a day (BID) | ORAL | 0 refills | Status: AC

## 2022-01-15 NOTE — Progress Notes (Signed)

## 2022-02-13 DIAGNOSIS — F4321 Adjustment disorder with depressed mood: Secondary | ICD-10-CM | POA: Diagnosis not present

## 2022-03-20 DIAGNOSIS — F4321 Adjustment disorder with depressed mood: Secondary | ICD-10-CM | POA: Diagnosis not present

## 2022-04-18 DIAGNOSIS — Z1231 Encounter for screening mammogram for malignant neoplasm of breast: Secondary | ICD-10-CM | POA: Diagnosis not present

## 2022-04-18 DIAGNOSIS — Z01419 Encounter for gynecological examination (general) (routine) without abnormal findings: Secondary | ICD-10-CM | POA: Diagnosis not present

## 2022-04-18 DIAGNOSIS — N9089 Other specified noninflammatory disorders of vulva and perineum: Secondary | ICD-10-CM | POA: Diagnosis not present

## 2022-04-18 DIAGNOSIS — Z1331 Encounter for screening for depression: Secondary | ICD-10-CM | POA: Diagnosis not present

## 2022-04-18 DIAGNOSIS — Z124 Encounter for screening for malignant neoplasm of cervix: Secondary | ICD-10-CM | POA: Diagnosis not present

## 2022-04-18 LAB — HM PAP SMEAR: HM Pap smear: NEGATIVE

## 2022-04-18 LAB — HM MAMMOGRAPHY

## 2022-05-03 DIAGNOSIS — N6320 Unspecified lump in the left breast, unspecified quadrant: Secondary | ICD-10-CM | POA: Diagnosis not present

## 2022-05-03 DIAGNOSIS — R928 Other abnormal and inconclusive findings on diagnostic imaging of breast: Secondary | ICD-10-CM | POA: Diagnosis not present

## 2022-05-03 DIAGNOSIS — R92322 Mammographic fibroglandular density, left breast: Secondary | ICD-10-CM | POA: Diagnosis not present

## 2022-05-16 ENCOUNTER — Encounter: Payer: Self-pay | Admitting: Sports Medicine

## 2022-05-17 ENCOUNTER — Encounter: Payer: Self-pay | Admitting: Sports Medicine

## 2022-06-25 DIAGNOSIS — Z803 Family history of malignant neoplasm of breast: Secondary | ICD-10-CM | POA: Diagnosis not present

## 2022-06-25 DIAGNOSIS — N6011 Diffuse cystic mastopathy of right breast: Secondary | ICD-10-CM | POA: Diagnosis not present

## 2022-06-25 DIAGNOSIS — N6012 Diffuse cystic mastopathy of left breast: Secondary | ICD-10-CM | POA: Diagnosis not present

## 2022-06-25 DIAGNOSIS — R92333 Mammographic heterogeneous density, bilateral breasts: Secondary | ICD-10-CM | POA: Diagnosis not present

## 2022-06-25 DIAGNOSIS — R928 Other abnormal and inconclusive findings on diagnostic imaging of breast: Secondary | ICD-10-CM | POA: Diagnosis not present

## 2022-06-26 DIAGNOSIS — R0981 Nasal congestion: Secondary | ICD-10-CM | POA: Diagnosis not present

## 2022-06-26 DIAGNOSIS — J019 Acute sinusitis, unspecified: Secondary | ICD-10-CM | POA: Diagnosis not present

## 2022-08-05 DIAGNOSIS — Z139 Encounter for screening, unspecified: Secondary | ICD-10-CM | POA: Diagnosis not present

## 2022-08-29 DIAGNOSIS — D225 Melanocytic nevi of trunk: Secondary | ICD-10-CM | POA: Diagnosis not present

## 2022-08-29 DIAGNOSIS — D398 Neoplasm of uncertain behavior of other specified female genital organs: Secondary | ICD-10-CM | POA: Diagnosis not present

## 2022-08-29 DIAGNOSIS — D369 Benign neoplasm, unspecified site: Secondary | ICD-10-CM | POA: Diagnosis not present

## 2022-09-02 DIAGNOSIS — E669 Obesity, unspecified: Secondary | ICD-10-CM | POA: Diagnosis not present

## 2022-09-04 DIAGNOSIS — Z139 Encounter for screening, unspecified: Secondary | ICD-10-CM | POA: Diagnosis not present

## 2022-09-16 DIAGNOSIS — Z713 Dietary counseling and surveillance: Secondary | ICD-10-CM | POA: Diagnosis not present

## 2022-09-16 DIAGNOSIS — Z6838 Body mass index (BMI) 38.0-38.9, adult: Secondary | ICD-10-CM | POA: Diagnosis not present

## 2022-09-18 DIAGNOSIS — Z6838 Body mass index (BMI) 38.0-38.9, adult: Secondary | ICD-10-CM | POA: Diagnosis not present

## 2022-09-18 DIAGNOSIS — R7303 Prediabetes: Secondary | ICD-10-CM | POA: Diagnosis not present

## 2022-09-18 DIAGNOSIS — E7800 Pure hypercholesterolemia, unspecified: Secondary | ICD-10-CM | POA: Diagnosis not present

## 2022-09-18 DIAGNOSIS — Z713 Dietary counseling and surveillance: Secondary | ICD-10-CM | POA: Diagnosis not present

## 2022-10-30 DIAGNOSIS — Z713 Dietary counseling and surveillance: Secondary | ICD-10-CM | POA: Diagnosis not present

## 2022-10-30 DIAGNOSIS — Z6838 Body mass index (BMI) 38.0-38.9, adult: Secondary | ICD-10-CM | POA: Diagnosis not present

## 2022-11-27 DIAGNOSIS — R2231 Localized swelling, mass and lump, right upper limb: Secondary | ICD-10-CM | POA: Diagnosis not present

## 2022-12-31 DIAGNOSIS — M7989 Other specified soft tissue disorders: Secondary | ICD-10-CM | POA: Diagnosis not present

## 2022-12-31 DIAGNOSIS — M67441 Ganglion, right hand: Secondary | ICD-10-CM | POA: Diagnosis not present

## 2022-12-31 DIAGNOSIS — M79644 Pain in right finger(s): Secondary | ICD-10-CM | POA: Diagnosis not present

## 2023-01-27 DIAGNOSIS — M13841 Other specified arthritis, right hand: Secondary | ICD-10-CM | POA: Diagnosis not present

## 2023-01-27 DIAGNOSIS — Z6838 Body mass index (BMI) 38.0-38.9, adult: Secondary | ICD-10-CM | POA: Diagnosis not present

## 2023-02-24 DIAGNOSIS — Z09 Encounter for follow-up examination after completed treatment for conditions other than malignant neoplasm: Secondary | ICD-10-CM | POA: Diagnosis not present

## 2023-02-24 DIAGNOSIS — Z6838 Body mass index (BMI) 38.0-38.9, adult: Secondary | ICD-10-CM | POA: Diagnosis not present

## 2023-03-10 DIAGNOSIS — R5381 Other malaise: Secondary | ICD-10-CM | POA: Diagnosis not present

## 2023-03-12 DIAGNOSIS — Z09 Encounter for follow-up examination after completed treatment for conditions other than malignant neoplasm: Secondary | ICD-10-CM | POA: Diagnosis not present

## 2023-03-12 DIAGNOSIS — B349 Viral infection, unspecified: Secondary | ICD-10-CM | POA: Diagnosis not present

## 2023-03-12 DIAGNOSIS — H748X9 Other specified disorders of middle ear and mastoid, unspecified ear: Secondary | ICD-10-CM | POA: Diagnosis not present

## 2023-03-24 DIAGNOSIS — E785 Hyperlipidemia, unspecified: Secondary | ICD-10-CM | POA: Diagnosis not present

## 2023-04-07 DIAGNOSIS — E7800 Pure hypercholesterolemia, unspecified: Secondary | ICD-10-CM | POA: Diagnosis not present

## 2023-04-21 DIAGNOSIS — Z7689 Persons encountering health services in other specified circumstances: Secondary | ICD-10-CM | POA: Diagnosis not present

## 2023-04-21 DIAGNOSIS — Z713 Dietary counseling and surveillance: Secondary | ICD-10-CM | POA: Diagnosis not present

## 2023-04-22 DIAGNOSIS — Z124 Encounter for screening for malignant neoplasm of cervix: Secondary | ICD-10-CM | POA: Diagnosis not present

## 2023-04-22 DIAGNOSIS — Z01419 Encounter for gynecological examination (general) (routine) without abnormal findings: Secondary | ICD-10-CM | POA: Diagnosis not present

## 2023-04-22 DIAGNOSIS — Z1389 Encounter for screening for other disorder: Secondary | ICD-10-CM | POA: Diagnosis not present

## 2023-04-22 DIAGNOSIS — Z1231 Encounter for screening mammogram for malignant neoplasm of breast: Secondary | ICD-10-CM | POA: Diagnosis not present

## 2023-04-22 LAB — HM MAMMOGRAPHY

## 2023-04-29 LAB — HM PAP SMEAR

## 2023-05-01 DIAGNOSIS — D2262 Melanocytic nevi of left upper limb, including shoulder: Secondary | ICD-10-CM | POA: Diagnosis not present

## 2023-05-01 DIAGNOSIS — L573 Poikiloderma of Civatte: Secondary | ICD-10-CM | POA: Diagnosis not present

## 2023-05-01 DIAGNOSIS — D2261 Melanocytic nevi of right upper limb, including shoulder: Secondary | ICD-10-CM | POA: Diagnosis not present

## 2023-05-01 DIAGNOSIS — D225 Melanocytic nevi of trunk: Secondary | ICD-10-CM | POA: Diagnosis not present

## 2023-05-01 DIAGNOSIS — Z7689 Persons encountering health services in other specified circumstances: Secondary | ICD-10-CM | POA: Diagnosis not present

## 2023-05-01 DIAGNOSIS — Z639 Problem related to primary support group, unspecified: Secondary | ICD-10-CM | POA: Diagnosis not present

## 2023-05-23 DIAGNOSIS — Z639 Problem related to primary support group, unspecified: Secondary | ICD-10-CM | POA: Diagnosis not present

## 2023-06-02 DIAGNOSIS — E782 Mixed hyperlipidemia: Secondary | ICD-10-CM | POA: Diagnosis not present

## 2023-06-02 DIAGNOSIS — R7301 Impaired fasting glucose: Secondary | ICD-10-CM | POA: Diagnosis not present

## 2023-06-02 DIAGNOSIS — Z0000 Encounter for general adult medical examination without abnormal findings: Secondary | ICD-10-CM | POA: Diagnosis not present

## 2023-06-02 DIAGNOSIS — R7989 Other specified abnormal findings of blood chemistry: Secondary | ICD-10-CM | POA: Diagnosis not present

## 2023-06-19 DIAGNOSIS — Z639 Problem related to primary support group, unspecified: Secondary | ICD-10-CM | POA: Diagnosis not present

## 2023-07-21 DIAGNOSIS — Z713 Dietary counseling and surveillance: Secondary | ICD-10-CM | POA: Diagnosis not present

## 2023-07-21 DIAGNOSIS — Z6838 Body mass index (BMI) 38.0-38.9, adult: Secondary | ICD-10-CM | POA: Diagnosis not present

## 2023-09-30 ENCOUNTER — Encounter: Payer: Self-pay | Admitting: Sports Medicine

## 2023-10-15 DIAGNOSIS — H6593 Unspecified nonsuppurative otitis media, bilateral: Secondary | ICD-10-CM | POA: Diagnosis not present

## 2023-10-29 DIAGNOSIS — Z713 Dietary counseling and surveillance: Secondary | ICD-10-CM | POA: Diagnosis not present

## 2023-10-29 DIAGNOSIS — Z6838 Body mass index (BMI) 38.0-38.9, adult: Secondary | ICD-10-CM | POA: Diagnosis not present

## 2023-12-08 DIAGNOSIS — Z008 Encounter for other general examination: Secondary | ICD-10-CM | POA: Diagnosis not present

## 2024-01-07 DIAGNOSIS — J019 Acute sinusitis, unspecified: Secondary | ICD-10-CM | POA: Diagnosis not present
